# Patient Record
Sex: Male | Born: 1960 | Race: Black or African American | Hispanic: No | Smoking: Never smoker
Health system: Southern US, Community
[De-identification: ages and names within clinical notes are randomized; demographics above are authoritative.]

## PROBLEM LIST (undated history)

## (undated) DIAGNOSIS — M1990 Unspecified osteoarthritis, unspecified site: Secondary | ICD-10-CM

## (undated) DIAGNOSIS — IMO0002 Reserved for concepts with insufficient information to code with codable children: Secondary | ICD-10-CM

## (undated) HISTORY — PX: CYST REMOVAL TRUNK: SHX6283

## (undated) HISTORY — DX: Unspecified osteoarthritis, unspecified site: M19.90

## (undated) HISTORY — DX: Reserved for concepts with insufficient information to code with codable children: IMO0002

---

## 2000-02-27 ENCOUNTER — Emergency Department (HOSPITAL_COMMUNITY): Admission: EM | Admit: 2000-02-27 | Discharge: 2000-02-27 | Payer: Self-pay | Admitting: Emergency Medicine

## 2001-10-05 ENCOUNTER — Encounter: Admission: RE | Admit: 2001-10-05 | Discharge: 2001-10-05 | Payer: Self-pay | Admitting: Occupational Medicine

## 2001-10-05 ENCOUNTER — Encounter: Payer: Self-pay | Admitting: Occupational Medicine

## 2001-10-13 ENCOUNTER — Encounter: Admission: RE | Admit: 2001-10-13 | Discharge: 2001-10-27 | Payer: Self-pay | Admitting: Occupational Medicine

## 2003-06-14 ENCOUNTER — Emergency Department (HOSPITAL_COMMUNITY): Admission: EM | Admit: 2003-06-14 | Discharge: 2003-06-14 | Payer: Self-pay | Admitting: Emergency Medicine

## 2004-06-11 ENCOUNTER — Ambulatory Visit: Payer: Self-pay | Admitting: Internal Medicine

## 2005-10-21 ENCOUNTER — Ambulatory Visit: Payer: Self-pay | Admitting: Internal Medicine

## 2005-11-18 ENCOUNTER — Ambulatory Visit: Payer: Self-pay | Admitting: Internal Medicine

## 2005-11-26 ENCOUNTER — Ambulatory Visit: Payer: Self-pay | Admitting: Internal Medicine

## 2005-12-30 ENCOUNTER — Ambulatory Visit: Payer: Self-pay | Admitting: Internal Medicine

## 2006-01-13 ENCOUNTER — Ambulatory Visit: Payer: Self-pay | Admitting: Internal Medicine

## 2006-04-12 ENCOUNTER — Ambulatory Visit: Payer: Self-pay | Admitting: Internal Medicine

## 2006-05-24 ENCOUNTER — Ambulatory Visit: Payer: Self-pay | Admitting: Internal Medicine

## 2006-06-30 ENCOUNTER — Ambulatory Visit: Payer: Self-pay | Admitting: Internal Medicine

## 2007-01-17 DIAGNOSIS — J309 Allergic rhinitis, unspecified: Secondary | ICD-10-CM | POA: Insufficient documentation

## 2007-01-24 ENCOUNTER — Ambulatory Visit: Payer: Self-pay | Admitting: Internal Medicine

## 2007-01-24 LAB — CONVERTED CEMR LAB
ALT: 18 units/L (ref 0–53)
AST: 25 units/L (ref 0–37)
Albumin: 3.9 g/dL (ref 3.5–5.2)
Alkaline Phosphatase: 47 units/L (ref 39–117)
BUN: 14 mg/dL (ref 6–23)
Basophils Absolute: 0 10*3/uL (ref 0.0–0.1)
Basophils Relative: 0.7 % (ref 0.0–1.0)
Bilirubin Urine: NEGATIVE
Bilirubin, Direct: 0.2 mg/dL (ref 0.0–0.3)
CO2: 31 meq/L (ref 19–32)
Calcium: 9.1 mg/dL (ref 8.4–10.5)
Chloride: 106 meq/L (ref 96–112)
Cholesterol: 177 mg/dL (ref 0–200)
Creatinine, Ser: 1.2 mg/dL (ref 0.4–1.5)
Eosinophils Absolute: 0.1 10*3/uL (ref 0.0–0.6)
Eosinophils Relative: 3.4 % (ref 0.0–5.0)
GFR calc Af Amer: 83 mL/min
GFR calc non Af Amer: 69 mL/min
Glucose, Bld: 91 mg/dL (ref 70–99)
Glucose, Urine, Semiquant: NEGATIVE
HCT: 42.2 % (ref 39.0–52.0)
HDL: 41.2 mg/dL (ref >39.0)
Hemoglobin: 14.5 g/dL (ref 13.0–17.0)
Ketones, urine, test strip: NEGATIVE
LDL Cholesterol: 128 mg/dL — ABNORMAL HIGH (ref 0–99)
Lymphocytes Relative: 33.9 % (ref 12.0–46.0)
MCHC: 34.4 g/dL (ref 30.0–36.0)
MCV: 95 fL (ref 78.0–100.0)
Monocytes Absolute: 0.3 10*3/uL (ref 0.2–0.7)
Monocytes Relative: 11.5 % — ABNORMAL HIGH (ref 3.0–11.0)
Neutro Abs: 1.5 10*3/uL (ref 1.4–7.7)
Neutrophils Relative %: 50.5 % (ref 43.0–77.0)
Nitrite: NEGATIVE
Platelets: 210 10*3/uL (ref 150–400)
Potassium: 4.4 meq/L (ref 3.5–5.1)
Protein, U semiquant: NEGATIVE
RBC: 4.45 M/uL (ref 4.22–5.81)
RDW: 12.8 % (ref 11.5–14.6)
Sodium: 142 meq/L (ref 135–145)
Specific Gravity, Urine: >=1.03
TSH: 1.29 microintl units/mL (ref 0.35–5.50)
Total Bilirubin: 2.1 mg/dL — ABNORMAL HIGH (ref 0.3–1.2)
Total CHOL/HDL Ratio: 4.3
Total Protein: 6.7 g/dL (ref 6.0–8.3)
Triglycerides: 38 mg/dL (ref 0–149)
Urobilinogen, UA: 0.2
VLDL: 8 mg/dL (ref 0–40)
WBC Urine, dipstick: NEGATIVE
WBC: 2.9 10*3/uL — ABNORMAL LOW (ref 4.5–10.5)
pH: 5.5

## 2007-01-31 ENCOUNTER — Ambulatory Visit: Payer: Self-pay | Admitting: Internal Medicine

## 2007-01-31 DIAGNOSIS — B360 Pityriasis versicolor: Secondary | ICD-10-CM | POA: Insufficient documentation

## 2007-03-30 ENCOUNTER — Ambulatory Visit: Payer: Self-pay | Admitting: Internal Medicine

## 2007-03-30 DIAGNOSIS — M1990 Unspecified osteoarthritis, unspecified site: Secondary | ICD-10-CM | POA: Insufficient documentation

## 2007-03-30 DIAGNOSIS — IMO0002 Reserved for concepts with insufficient information to code with codable children: Secondary | ICD-10-CM | POA: Insufficient documentation

## 2007-03-30 HISTORY — DX: Reserved for concepts with insufficient information to code with codable children: IMO0002

## 2007-03-31 ENCOUNTER — Telehealth: Payer: Self-pay | Admitting: Internal Medicine

## 2007-04-07 ENCOUNTER — Encounter: Payer: Self-pay | Admitting: Internal Medicine

## 2007-09-19 ENCOUNTER — Telehealth: Payer: Self-pay | Admitting: Internal Medicine

## 2009-06-03 ENCOUNTER — Telehealth: Payer: Self-pay | Admitting: Internal Medicine

## 2009-06-04 ENCOUNTER — Ambulatory Visit: Payer: Self-pay | Admitting: Internal Medicine

## 2009-06-04 DIAGNOSIS — R05 Cough: Secondary | ICD-10-CM

## 2009-06-04 DIAGNOSIS — J209 Acute bronchitis, unspecified: Secondary | ICD-10-CM

## 2009-06-04 DIAGNOSIS — R059 Cough, unspecified: Secondary | ICD-10-CM | POA: Insufficient documentation

## 2009-10-30 ENCOUNTER — Ambulatory Visit: Payer: Self-pay | Admitting: Internal Medicine

## 2009-10-30 LAB — CONVERTED CEMR LAB
ALT: 20 units/L (ref 0–53)
AST: 39 units/L — ABNORMAL HIGH (ref 0–37)
Albumin: 4.2 g/dL (ref 3.5–5.2)
Alkaline Phosphatase: 47 units/L (ref 39–117)
BUN: 14 mg/dL (ref 6–23)
Basophils Absolute: 0 10*3/uL (ref 0.0–0.1)
Basophils Relative: 0.9 % (ref 0.0–3.0)
Bilirubin, Direct: 0.2 mg/dL (ref 0.0–0.3)
Blood in Urine, dipstick: NEGATIVE
CO2: 30 meq/L (ref 19–32)
Calcium: 9 mg/dL (ref 8.4–10.5)
Chloride: 102 meq/L (ref 96–112)
Cholesterol: 166 mg/dL (ref 0–200)
Creatinine, Ser: 1 mg/dL (ref 0.4–1.5)
Eosinophils Absolute: 0.1 10*3/uL (ref 0.0–0.7)
Eosinophils Relative: 3 % (ref 0.0–5.0)
GFR calc non Af Amer: 103.8 mL/min (ref >60)
Glucose, Bld: 75 mg/dL (ref 70–99)
Glucose, Urine, Semiquant: NEGATIVE
HCT: 43.7 % (ref 39.0–52.0)
HDL: 47.4 mg/dL (ref >39.00)
Hemoglobin: 14.9 g/dL (ref 13.0–17.0)
LDL Cholesterol: 110 mg/dL — ABNORMAL HIGH (ref 0–99)
Lymphocytes Relative: 32.9 % (ref 12.0–46.0)
Lymphs Abs: 1.1 10*3/uL (ref 0.7–4.0)
MCHC: 34.3 g/dL (ref 30.0–36.0)
MCV: 96.1 fL (ref 78.0–100.0)
Monocytes Absolute: 0.3 10*3/uL (ref 0.1–1.0)
Monocytes Relative: 10.1 % (ref 3.0–12.0)
Neutro Abs: 1.8 10*3/uL (ref 1.4–7.7)
Neutrophils Relative %: 53.1 % (ref 43.0–77.0)
Nitrite: NEGATIVE
PSA: 0.93 ng/mL (ref 0.10–4.00)
Platelets: 184 10*3/uL (ref 150.0–400.0)
Potassium: 4.1 meq/L (ref 3.5–5.1)
RBC: 4.54 M/uL (ref 4.22–5.81)
RDW: 14.3 % (ref 11.5–14.6)
Sodium: 141 meq/L (ref 135–145)
Specific Gravity, Urine: 1.02
TSH: 1.41 microintl units/mL (ref 0.35–5.50)
Total Bilirubin: 1.6 mg/dL — ABNORMAL HIGH (ref 0.3–1.2)
Total CHOL/HDL Ratio: 4
Total Protein: 6.9 g/dL (ref 6.0–8.3)
Triglycerides: 45 mg/dL (ref 0.0–149.0)
Urobilinogen, UA: 1
VLDL: 9 mg/dL (ref 0.0–40.0)
WBC Urine, dipstick: NEGATIVE
WBC: 3.4 10*3/uL — ABNORMAL LOW (ref 4.5–10.5)
pH: 7

## 2009-11-06 ENCOUNTER — Ambulatory Visit: Payer: Self-pay | Admitting: Internal Medicine

## 2009-11-06 DIAGNOSIS — D045 Carcinoma in situ of skin of trunk: Secondary | ICD-10-CM

## 2009-12-09 ENCOUNTER — Telehealth: Payer: Self-pay | Admitting: Internal Medicine

## 2009-12-18 ENCOUNTER — Encounter (INDEPENDENT_AMBULATORY_CARE_PROVIDER_SITE_OTHER): Payer: Self-pay | Admitting: *Deleted

## 2010-05-05 ENCOUNTER — Encounter (INDEPENDENT_AMBULATORY_CARE_PROVIDER_SITE_OTHER): Payer: Self-pay | Admitting: *Deleted

## 2010-05-05 ENCOUNTER — Telehealth: Payer: Self-pay | Admitting: Internal Medicine

## 2010-05-08 ENCOUNTER — Encounter (INDEPENDENT_AMBULATORY_CARE_PROVIDER_SITE_OTHER): Payer: Self-pay | Admitting: *Deleted

## 2010-05-20 ENCOUNTER — Encounter (INDEPENDENT_AMBULATORY_CARE_PROVIDER_SITE_OTHER): Payer: Self-pay | Admitting: *Deleted

## 2010-05-21 ENCOUNTER — Telehealth: Payer: Self-pay | Admitting: Internal Medicine

## 2010-05-21 ENCOUNTER — Ambulatory Visit: Payer: Self-pay | Admitting: Internal Medicine

## 2010-06-01 ENCOUNTER — Ambulatory Visit: Payer: Self-pay | Admitting: Internal Medicine

## 2010-06-02 ENCOUNTER — Encounter: Payer: Self-pay | Admitting: Internal Medicine

## 2010-06-26 ENCOUNTER — Telehealth: Payer: Self-pay | Admitting: Internal Medicine

## 2010-06-29 ENCOUNTER — Ambulatory Visit
Admission: RE | Admit: 2010-06-29 | Discharge: 2010-06-29 | Payer: Self-pay | Source: Home / Self Care | Attending: Internal Medicine | Admitting: Internal Medicine

## 2010-07-02 ENCOUNTER — Telehealth: Payer: Self-pay | Admitting: Internal Medicine

## 2010-07-12 ENCOUNTER — Encounter: Payer: Self-pay | Admitting: Internal Medicine

## 2010-07-23 NOTE — Miscellaneous (Signed)
Summary: LEC PV  Clinical Lists Changes  Medications: Added new medication of MOVIPREP 100 GM  SOLR (PEG-KCL-NACL-NASULF-NA ASC-C) As per prep instructions. - Signed Rx of MOVIPREP 100 GM  SOLR (PEG-KCL-NACL-NASULF-NA ASC-C) As per prep instructions.;  #1 x 0;  Signed;  Entered by: Ezra Sites RN;  Authorized by: Iva Boop MD, [facility];  Method used: Electronically to [facility]. #[id number]*, [address], [place], [place], [place]  [address], Ph: [phone], Fax: [phone] Observations: Added new observation of NKA: T (05/21/2010 10:02)    Prescriptions: MOVIPREP 100 GM  SOLR (PEG-KCL-NACL-NASULF-NA ASC-C) As per prep instructions.  #1 x 0   Entered by:   Ezra Sites RN   Authorized by:   Iva Boop MD, [facility]   Signed by:   Ezra Sites RN on 05/21/2010   Method used:   Electronically to        [facility]. [phone]* (retail)       [address]       [place], [place]  [address]       Ph: [phone]       Fax: [phone]   RxID:   [id number]

## 2010-07-23 NOTE — Letter (Signed)
Summary: Pre Visit Letter Revised  [facility]  [address] [place], [place] [address]   Phone: [phone]  Fax: [phone]        05/05/2010 MRN: [id number]   [facility] [address] [place], [place]  [address]             Procedure Date:  May 26, 2010  Welcome to the Gastroenterology Division at [facility].    You are scheduled to see a nurse for your pre-procedure visit on 05/08/10 at 1:00 P.M.  on the 3rd floor at [facility], [address]  We ask that you try to arrive at our office 15 minutes prior to your appointment time to allow for check-in.  Please take a minute to review the attached form.  If you answer "Yes" to one or more of the questions on the first page, we ask that you call the person listed at your earliest opportunity.  If you answer "No" to all of the questions, please complete the rest of the form and bring it to your appointment.    Your nurse visit will consist of discussing your medical and surgical history, your immediate family medical history, and your medications.   If you are unable to list all of your medications on the form, please bring the medication bottles to your appointment and we will list them.  We will need to be aware of both prescribed and over the counter drugs.  We will need to know exact dosage information as well.    Please be prepared to read and sign documents such as consent forms, a financial agreement, and acknowledgement forms.  If necessary, and with your consent, a friend or relative is welcome to sit-in on the nurse visit with you.  Please bring your insurance card so that we may make a copy of it.  If your insurance requires a referral to see a specialist, please bring your referral form from your primary care physician.  No co-pay is required for this nurse visit.     If you cannot keep your appointment, please call [phone] to cancel or reschedule prior to your appointment  date.  This allows [place] the opportunity to schedule an appointment for another patient in need of care.    Thank you for choosing [facility] for your medical needs.  We appreciate the opportunity to care for you.  Please visit [place] at our website  to learn more about our practice.  Sincerely, The Gastroenterology Division

## 2010-07-23 NOTE — Progress Notes (Signed)
  Phone Note Call from Patient   Summary of Call: request z pack for uri/bmw Initial call taken by: Willy Eddy, LPN,  December 09, 2009 10:39 AM  Follow-up for Phone Call        per dr Lovell Sheehan- may have [facility] pack[facility] [phone] Follow-up by: Willy Eddy, LPN,  December 09, 2009 10:39 AM    New/Updated Medications: ZITHROMAX Z-PAK 250 MG TABS (AZITHROMYCIN) take as directed Prescriptions: ZITHROMAX Z-PAK 250 MG TABS (AZITHROMYCIN) take as directed  #1 x 0   Entered by:   Willy Eddy, LPN   Authorized by:   Stacie Glaze MD   Signed by:   Willy Eddy, LPN on 57/84/6962   Method used:   Electronically to        [facility]. [phone]* (retail)       [address]       [place], [place]  [address]       Ph: [phone]       Fax: [phone]   RxID:   [id number]

## 2010-07-23 NOTE — Progress Notes (Signed)
  Phone Note Call from Patient   Caller: Patient Call For: Stacie Glaze MD Reason for Call: Acute Illness Complaint: Cough/Sore throat Summary of Call: Pt states he has some upper respiratory congestion and cough.  Would like the Atuss that Dr. Lovell Sheehan gave him a year ago.  He had a refill but it expired.  No fever or chest congestion...Marland KitchenMarland KitchenJust started 2 days ago...Marland KitchenMarland Kitchenasking about an antibiotic. Initial call taken by: Lynann Beaver CMA AAMA,  June 26, 2010 11:05 AM  Follow-up for Phone Call        per dr Lovell Sheehan- may refill atuss but if he request antibioitc,will need ov Follow-up by: Willy Eddy, LPN,  June 26, 2010 11:27 AM  Additional Follow-up for Phone Call Additional follow up Details #1::        Notified pt. Additional Follow-up by: Lynann Beaver CMA AAMA,  June 26, 2010 11:36 AM

## 2010-07-23 NOTE — Letter (Signed)
Summary: [facility] Referral (unable to schedule) Notification  [facility]  [address] [place], [place] [address]   Phone: [phone]  Fax: [phone]      December 18, 2009 Joseph Boyle 1961/04/16 MRN: [id number]   [facility] [address] [place], [place]  [address]   Dear Dr. Lovell Sheehan:   Thank you for your kind referral of the above patient. We have attempted to schedule the recommended Colonoscopy but have been unable to schedule because:  _x_ The patient was not available by phone and/or has not returned our calls.  __ The patient declined to schedule the procedure at this time.  We appreciate the referral and hope that we will have the opportunity to treat this patient in the future.    Sincerely,   [facility]  Vania Rea. Jarold Motto M.D. Hedwig Morton. Juanda Chance M.D. Venita Lick. Russella Dar M.D. Wilhemina Bonito. Marina Goodell M.D. Barbette Hair. Arlyce Dice M.D. Iva Boop M.D. Cheron Every.D.

## 2010-07-23 NOTE — Progress Notes (Signed)
Summary: colonoscopy  Phone Note Call from Patient   Caller: Patient Call For: Stacie Glaze MD Summary of Call: Pt is having a colonoscopy and wants to know if it is necessary to do this. [phone]  Pt. wants to talk to Dr. Lovell Sheehan, Initial call taken by: Lynann Beaver CMA AAMA,  May 21, 2010 10:38 AM  Follow-up for Phone Call        yes!!!! Follow-up by: Stacie Glaze MD,  May 21, 2010 10:45 AM     Appended Document: colonoscopy left message on machine

## 2010-07-23 NOTE — Assessment & Plan Note (Signed)
Summary: cough//ccm   Vital Signs:  Patient profile:   50 year old male Pulse rate:   70 / minute BP sitting:   120 / 90  (right arm)  Vitals Entered By: Kyung Rudd, CMA (June 29, 2010 1:14 PM) CC: cough, congestion x 4days...requesting z-pak. Atuss ds rx'ed by Dr. Lovell Sheehan on Friday is no longer being manufactured according to [facility]    CC:  cough and congestion x 4days...requesting z-pak. Atuss ds rx'ed by Dr. Lovell Sheehan on Friday is no longer being manufactured according to [facility] .  History of Present Illness: Patient presents to clinic as a workin for evaluation of cough. Notes 4d h/o cough productive for yellow sputum, scratchy throat and nasal drainage without f/c, dyspnea or wheeze. Attempting otc mucinex dm with mild improvement. +sick exposure with daughter and wife. No other alleviating or exacerbating factors.  Current Medications (verified): 1)  None  Allergies (verified): No Known Drug Allergies  Past History:  Past medical, surgical, family and social histories (including risk factors) reviewed for relevance to current acute and chronic problems.  Past Medical History: Reviewed history from 03/30/2007 and no changes required. Allergic rhinitis Osteoarthritis  Past Surgical History: Reviewed history from 01/31/2007 and no changes required. Rotator cuff repair 2007  Family History: Reviewed history from 01/31/2007 and no changes required. Father  unknown Family History Hypertension Mother  Social History: Reviewed history from 01/31/2007 and no changes required. Never Smoked Alcohol use-yes Regular exercise-yes  Review of Systems      See HPI General:  Denies chills, fatigue, fever, and sweats. Eyes:  Denies eye irritation, eye pain, and red eye. ENT:  Complains of sore throat; denies ear discharge and earache. Resp:  Complains of cough and sputum productive; denies coughing up blood, shortness of breath, and wheezing. Derm:   Denies changes in color of skin, flushing, and rash.  Physical Exam  General:  Well-developed,well-nourished,in no acute distress; alert,appropriate and cooperative throughout examination Head:  Normocephalic and atraumatic without obvious abnormalities. No apparent alopecia or balding. Eyes:  pupils equal, pupils round, corneas and lenses clear, and no injection.   Ears:  External ear exam shows no significant lesions or deformities.  Otoscopic examination reveals clear canals, tympanic membranes are intact bilaterally without bulging, retraction, inflammation or discharge. Hearing is grossly normal bilaterally. Nose:  External nasal examination shows no deformity or inflammation. Nasal mucosa are pink and moist without lesions or exudates. Mouth:  mild erythema posterior pharynx without exudate. no posterior lymphoid hypertrophy, no lesions, and no aphthous ulcers.   Neck:  No deformities, masses, or tenderness noted. Lungs:  Normal respiratory effort, chest expands symmetrically. Lungs are clear to auscultation, no crackles or wheezes. Heart:  Normal rate and regular rhythm. S1 and S2 normal without gallop, murmur, click, rub or other extra sounds. Skin:  turgor normal, color normal, and no rashes.     Impression & Recommendations:  Problem # 1:  URI (ICD-465.9) Assessment New Discussed with duration of symptoms etiology may still be viral. Given abx prescription to hold. Begin if symptoms no better after total of 8-10 days. Continue otc symptomatic relief as needed. Increase by mouth fluid intake. Followup if no improvement or worsening.  Complete Medication List: 1)  Zithromax Z-pak 250 Mg Tabs (Azithromycin) .... As directed Prescriptions: ZITHROMAX Z-PAK 250 MG TABS (AZITHROMYCIN) as directed  #1 x 0   Entered and Authorized by:   Edwyna Perfect MD   Signed by:   Edwyna Perfect MD  on 06/29/2010   Method used:   Print then Give to Patient   RxID:   [id number]    Orders  Added: 1)  Est. Patient Level III [03474]

## 2010-07-23 NOTE — Procedures (Signed)
Summary: Colonoscopy  Patient: Joseph Boyle Note: All result statuses are Final unless otherwise noted.  Tests: (1) Colonoscopy (COL)   COL Colonoscopy           DONE     [facility]     [address]     [place], [place]  [address]           COLONOSCOPY PROCEDURE REPORT           PATIENT:  [name], [name]  MR#:  [id number]     BIRTHDATE:  1961-04-08, 49 yrs. old  GENDER:  male     ENDOSCOPIST:  Iva Boop, MD, [facility]     REF. BY:  Stacie Glaze, M.D.     PROCEDURE DATE:  06/01/2010     PROCEDURE:  Colonoscopy with biopsy and snare polypectomy     ASA CLASS:  Class I     INDICATIONS:  Routine Risk Screening     MEDICATIONS:   Fentanyl 75 mcg IV, Versed 10 mg IV           DESCRIPTION OF PROCEDURE:   After the risks benefits and     alternatives of the procedure were thoroughly explained, informed     consent was obtained.  Digital rectal exam was performed and     revealed no abnormalities and normal prostate.   The LB CF-H180AL     [id number] endoscope was introduced through the anus and advanced to     the cecum, which was identified by both the appendix and ileocecal     valve, without limitations.  The quality of the prep was     excellent, using MoviPrep.  The instrument was then slowly     withdrawn as the colon was fully examined. Insertion: 10:20     minutes Withdrawal: 15 minutes     <<PROCEDUREIMAGES>>           FINDINGS:  Four polyps were found (ascending (2) and descending     (2). They were diminutive. Two  polyps were removed using cold     biopsy forceps. and two polyps were snared without cautery.     Retrieval was successful for one of two. The coon was somewhat     redundant. This was otherwise a normal examination of the colon.     Retroflexed views in the rectum revealed no abnormalities.    The     scope was then withdrawn from the patient and the procedure     completed.           COMPLICATIONS:  None     ENDOSCOPIC IMPRESSION:     1) Four  diminutive  polyps removed, three recovered     2) Otherwise normal examination, excellent prep.     RECOMMENDATIONS:     Consider deep sedation next time.     REPEAT EXAM:  In for Colonoscopy, pending biopsy results.           Iva Boop, MD, [facility]           CC:  Stacie Glaze, MD     The Patient           n.     [name] Doctor:   Iva Boop at 06/01/2010 11:08 AM           Tyshan, [place], [id number]  Note: An exclamation mark (!) indicates a result that was not dispersed into the flowsheet. Document Creation Date: 06/01/2010 11:09 AM _______________________________________________________________________  (  1) Order result status: Final Collection or observation date-time: 06/01/2010 10:55 Requested date-time:  Receipt date-time:  Reported date-time:  Referring Physician:   Ordering Physician: Stan Head [phone]) Specimen Source:  Source: Launa Grill Order Number: [id number] Lab site:   Appended Document: Colonoscopy   Colonoscopy  Procedure date:  06/01/2010  Findings:          1) Four diminutive  polyps removed, three recovered     2) Otherwise normal examination, excellent prep.  . Colon, polyp(s), ascending (1), descending (2) :  -  TUBULAR ADENOMA (X1); NEGATIVE FOR HIGH GRADE DYSPLASIA OR MALIGNANCY. -  FRAGMENTS OF HYPERPLASTIC POLYPS (X2).  Comments:      Repeat colonoscopy in 5 years.   Procedures Next Due Date:    Colonoscopy: 05/2015   Appended Document: Colonoscopy     Procedures Next Due Date:    Colonoscopy: 05/2015

## 2010-07-23 NOTE — Letter (Signed)
Summary: Patient Notice- Polyp Results  [facility]  [address] [place], [place] [address]   Phone: [phone]  Fax: [phone]        June 02, 2010 MRN: [id number]    [facility] [address] [place], [place]  [address]    Dear Joseph Boyle,  Some of the polyps removed from your colon were adenomatous. This means that they were pre-cancerous or that  they had the potential to change into cancer over time. The other polyps were not precancerous.  I recommend that you have a repeat colonoscopy in 5 years to determine if you have developed any new polyps over time and screen for colorectal cancer. If you develop any new rectal bleeding, abdominal pain or significant bowel habit changes, please contact us before then.  In addition to repeating colonoscopy, changing health habits may reduce your risk of having more colon or rectal  polyps and possibly, colorectal cancer. You may lower your risk of future polyps and colorectal cancer by adopting healthy habits such as not smoking or using tobacco (if you do), being physically active, losing weight (if overweight), and eating a diet which includes fruits and vegetables and limits red meat.  Please call us if you are having persistent problems or have questions about your condition that have not been fully answered at this time.  Sincerely,  Iva Boop MD, [facility]  This letter has been electronically signed by your physician.  Appended Document: Patient Notice- Polyp Results Letter mailed

## 2010-07-23 NOTE — Letter (Signed)
Summary: [facility] Instructions  [facility]  [address] [place], [place] [address]   Phone: [phone]  Fax: [phone]       NAYIB REMER    Jun 06, 1961    MRN: [id number]        Procedure Day /Date:  Monday 06/01/2010     Arrival Time:  9:00 am      Procedure Time: 10:00 am     Location of Procedure:                    _x _  [facility] (4th Floor)                        PREPARATION FOR COLONOSCOPY WITH MOVIPREP   Starting 5 days prior to your procedure Wednesday 12/7 do not eat nuts, seeds, popcorn, corn, beans, peas,  salads, or any raw vegetables.  Do not take any fiber supplements (e.g. Metamucil, Citrucel, and Benefiber).  THE DAY BEFORE YOUR PROCEDURE         DATE: Sunday 12/11  1.  Drink clear liquids the entire day-NO SOLID FOOD  2.  Do not drink anything colored red or purple.  Avoid juices with pulp.  No orange juice.  3.  Drink at least 64 oz. (8 glasses) of fluid/clear liquids during the day to prevent dehydration and help the prep work efficiently.  CLEAR LIQUIDS INCLUDE: Water Jello Ice Popsicles Tea (sugar ok, no milk/cream) Powdered fruit flavored drinks Coffee (sugar ok, no milk/cream) Gatorade Juice: apple, white grape, white cranberry  Lemonade Clear bullion, consomm, broth Carbonated beverages (any kind) Strained chicken noodle soup Hard Candy                             4.  In the morning, mix first dose of MoviPrep solution:    Empty 1 Pouch A and 1 Pouch B into the disposable container    Add lukewarm drinking water to the top line of the container. Mix to dissolve    Refrigerate (mixed solution should be used within 24 hrs)  5.  Begin drinking the prep at 5:00 p.m. The MoviPrep container is divided by 4 marks.   Every 15 minutes drink the solution down to the next mark (approximately 8 oz) until the full liter is complete.   6.  Follow completed prep with 16 oz of clear liquid of your choice  (Nothing red or purple).  Continue to drink clear liquids until bedtime.  7.  Before going to bed, mix second dose of MoviPrep solution:    Empty 1 Pouch A and 1 Pouch B into the disposable container    Add lukewarm drinking water to the top line of the container. Mix to dissolve    Refrigerate  THE DAY OF YOUR PROCEDURE      DATE: Monday 12/12  Beginning at 5:00 a.m. (5 hours before procedure):         1. Every 15 minutes, drink the solution down to the next mark (approx 8 oz) until the full liter is complete.  2. Follow completed prep with 16 oz. of clear liquid of your choice.    3. You may drink clear liquids until 8:00 am (2 HOURS BEFORE PROCEDURE).   MEDICATION INSTRUCTIONS  Unless otherwise instructed, you should take regular prescription medications with a small sip of water   as early as possible the morning  of your procedure.           OTHER INSTRUCTIONS  You will need a responsible adult at least 52 years of age to accompany you and drive you home.   This person must remain in the waiting room during your procedure.  Wear loose fitting clothing that is easily removed.  Leave jewelry and other valuables at home.  However, you may wish to bring a book to read or  an iPod/MP3 player to listen to music as you wait for your procedure to start.  Remove all body piercing jewelry and leave at home.  Total time from sign-in until discharge is approximately 2-3 hours.  You should go home directly after your procedure and rest.  You can resume normal activities the  day after your procedure.  The day of your procedure you should not:   Drive   Make legal decisions   Operate machinery   Drink alcohol   Return to work  You will receive specific instructions about eating, activities and medications before you leave.    The above instructions have been reviewed and explained to me by   Ezra Sites RN  May 21, 2010 10:16 AM     I fully understand  and can verbalize these instructions _____________________________ Date _________

## 2010-07-23 NOTE — Letter (Signed)
Summary: Pre Visit No Show Letter  [facility]  [address] [place], [place] [address]   Phone: [phone]  Fax: [phone]        May 08, 2010 MRN: [id number]    [facility] [address] [place], [place]  [address]    Dear Joseph Boyle,   We have been unable to reach you by phone concerning the pre-procedure visit that you missed on  Friday, 05-08-10. For this reason,your procedure scheduled on 05-26-10 has been cancelled. Our scheduling staff will gladly assist you with rescheduling your appointments at a more convenient time. Please call our office at [phone] between the hours of 8:00am and 5:00pm, press option #2 to reach an appointment scheduler. Please consider updating your contact numbers at this time so that we can reach you by phone in the future with schedule changes or results.    Thank you,    Sherren Kerns RN [facility]  Appended Document: Pre Visit No Show Letter Called patient home #  3 times,no answer. Left message with our number for patient to call back to rsc. Also called work # ,no answer. Letter sent and procedure cancelled since he did not return my call by 5pm today. Sherren Kerns RN  May 08, 2010 5:06 PM    Clinical Lists Changes

## 2010-07-23 NOTE — Assessment & Plan Note (Signed)
Summary: cpx/njr   Vital Signs:  Patient profile:   50 year old male Height:      71 inches Weight:      174 pounds BMI:     24.36 Temp:     98.1 degrees F oral Pulse rate:   68 / minute Resp:     14 per minute BP sitting:   130 / 80  (left arm)  Vitals Entered By: Willy Eddy, LPN (Nov 06, 2009 10:58 AM) CC: cpx-needs colonoscopy   CC:  cpx-needs colonoscopy.  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) 6)  Allergic Rhinitis  (ICD-477.9) 7)  Preventive Health Care  (ICD-V70.0) 8)  Allergic Rhinitis  (ICD-477.9)  Current Medications (verified): 1)  None  Allergies (verified): No Known Drug Allergies   Impression & Recommendations:  Problem # 1:  PREVENTIVE HEALTH CARE (ICD-V70.0)  Orders: EKG w/ Interpretation ([address]) Gastroenterology Referral (GI)  Td Booster: Td (12/23/2005)   Chol: 166 (10/30/2009)   HDL: 47.40 (10/30/2009)   LDL: 110 (10/30/2009)   TG: 45.0 (10/30/2009) TSH: 1.41 (10/30/2009)   PSA: 0.93 (10/30/2009)  Discussed using sunscreen, use of alcohol, drug use, self testicular exam, routine dental care, routine eye care, routine physical exam, seat belts, multiple vitamins, osteoporosis prevention, adequate calcium intake in diet, and recommendations for immunizations.  Discussed exercise and checking cholesterol.  Discussed gun safety, safe sex, and contraception. Also recommend checking PSA.  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  cpx 2)  mole removal   Bonnye to set up

## 2010-07-23 NOTE — Progress Notes (Signed)
Summary: change of meds  Phone Note Call from Patient   Caller: Patient Call For: Stacie Glaze MD Summary of Call: No Atuss at the pharmacy.  Pt is requesting Hydromet.  Okayed per Dr. Lovell Sheehan. Initial call taken by: Debby Meadows CMA AAMA,  July 02, 2010 9:01 AM    New/Updated Medications: HYDROMET 5-1.5 MG/5ML SYRP (HYDROCODONE-HOMATROPINE) 1-2 teaspoons q 12 hr as needed cough Prescriptions: HYDROMET 5-1.5 MG/5ML SYRP (HYDROCODONE-HOMATROPINE) 1-2 teaspoons q 12 hr as needed cough  #6 oz x 0   Entered by:   Lynann Beaver CMA AAMA   Authorized by:   Stacie Glaze MD   Signed by:   Lynann Beaver CMA AAMA on 07/02/2010   Method used:   Telephoned to ...       [facility]  [place]. [phone]* (retail)       [address]       [place], [place]  [address]       Ph: [phone]       Fax: [phone]   RxID:   [id number]

## 2010-07-23 NOTE — Progress Notes (Signed)
Summary: Pt wants to know if he needs to get conlonscopy done  Phone Note Call from Patient Call back at [phone] cell   Caller: Patient Reason for Call: Acute Illness Summary of Call: Pt would like to know if he needs to get a colonoscopy done this year or no? If so, could Dr Lovell Sheehan do referral to start the sch process.   Initial call taken by: Lucy Antigua,  May 05, 2010 3:19 PM  Follow-up for Phone Call        told pt that they had tried to contact him and was unsuccessful- gi number given and was to told to call them to set up colonoscopy Follow-up by: Willy Eddy, LPN,  May 05, 2010 3:34 PM

## 2010-08-25 ENCOUNTER — Ambulatory Visit: Payer: Self-pay | Admitting: Internal Medicine

## 2010-08-25 ENCOUNTER — Encounter: Payer: Self-pay | Admitting: Internal Medicine

## 2010-08-25 VITALS — BP 120/82 | Temp 98.2°F | Wt 182.0 lb

## 2010-08-25 DIAGNOSIS — J329 Chronic sinusitis, unspecified: Secondary | ICD-10-CM

## 2010-08-25 DIAGNOSIS — R05 Cough: Secondary | ICD-10-CM

## 2010-08-25 DIAGNOSIS — R059 Cough, unspecified: Secondary | ICD-10-CM

## 2010-08-25 MED ORDER — DOXYCYCLINE HYCLATE 100 MG PO TABS: 100.0000 mg | ORAL_TABLET | Freq: Two times a day (BID) | ORAL | Status: DC

## 2010-08-25 MED ORDER — BENZONATATE 200 MG PO CAPS: 200.0000 mg | ORAL_CAPSULE | Freq: Three times a day (TID) | ORAL | Status: AC | PRN

## 2010-08-25 MED ORDER — METHYLPREDNISOLONE (PAK) 4 MG PO TABS: 4.0000 mg | ORAL_TABLET | Freq: Every day | ORAL | Status: AC

## 2010-08-26 ENCOUNTER — Ambulatory Visit: Payer: Self-pay | Admitting: Internal Medicine

## 2010-11-05 ENCOUNTER — Encounter: Payer: Self-pay | Admitting: Internal Medicine

## 2010-11-06 NOTE — Assessment & Plan Note (Signed)
[facility]                             PULMONARY OFFICE NOTE   NAME:Joseph Boyle, Joseph Boyle                       MRN:          [id number]  DATE:06/30/2006                            DOB:          13-Jul-1960    PULMONARY/ALLERGY CONSULTATION   PROBLEM:  Allergy consultation at the kind request of Dr. Lovell Sheehan for  this 50 year old man with rash and pruritus.   HISTORY:  Over the last 3 to 4 months, he has had a problem with  generalized shifting itching. If he scratches the pruritic areas, he  gets raised inflammation that sounds like a localized hive. 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 His initial theory was that powder from the gloves was causing  the itching, although there has been no specific glove distribution rash  or symptom. He has had no respiratory problems. If he wears a hat, he  will get itching around the hat band. The heat of a shower makes this  much worse. In his work area, he is near large containers of warm water,  which raise the temperature of the workplace. Benadryl was initially  sufficient and seemed to help. He has been given Xyzal by Dr. Lovell Sheehan  and says that works fast. He only needs to take a Xyzal every 2 or 3  days. Based on his suspicion that latex gloves were the problem, he has  replaced those with latex-free and powder-free gloves, but he still  needed to use Xyzal most recently 3 days ago. It is not obvious that he  has been doing better since the glove change. He also gets pruritic if  he plays tennis and sweats. Since the problem just began in October, he  has not recognized a specific season pattern. We talked about other  potential exposures. His wife is using Garment/textile technologist. He  had her switch away from a fragrance containing soap. There is no  previous history of allergy or other significant health problems.   MEDICATIONS:  Xyzal or Benadryl used  p.r.n.  No other routine medications.   ALLERGIES:  No medication allergy.   REVIEW OF SYSTEMS:  Essentially as per HPI. He has felt well with no  weight loss, adenopathy, wheeze, edema, joint pain, abdominal cramps or  other changes.   PAST MEDICAL HISTORY:  1. Surgical procedure to remove a cyst near this left scapula.  2. No history of significant health problems and specifically no      history of liver or thyroid disease, asthma or rhinitis. No      problems with foods, insects, aspirin or contrast dye exposure.   SOCIAL HISTORY:  Nonsmoker, little alcohol. Plays a lot of tennis.  Married. He is a Media planner. Home  environment is unremarkable.   FAMILY HISTORY:  Nobody that he is aware of with significant allergy  problems.   OBJECTIVE:  This is a well-developed, well-nourished, apparently  comfortable gentleman.  SKIN: Clear.  ADENOPATHY: None noted.  HEENT: Conjunctivae and oral mucosa  are clear. Nasal airways are clear  with no significant secretions.  CHEST: Quiet, clear lung fields.  HEART: Sounds regular without murmur or gallop.  ABDOMEN: No enlargement of liver or spleen.  EXTREMITIES: No cyanosis, clubbing or edema.   I took a latex-free glove and pressed it firmly in a 4 inch stripe down  the volar surface of his right forearm. I took a latex glove and  performed the same maneuver on the inside of his left arm. There was no  response to either. He had last had Xyzal 2 days ago.   IMPRESSION:  Urticaria, possibly related to latex, but more likely  related to heat. The gloves may be warming his hands some, but I have  not been able to demonstrate anything very specific. He says that Dr.  Lovell Sheehan drew a latex glove down his sternum and that area broke out in a  hive. The suggestion would be that either latex or pressure is  triggering the response and that he has enough residual Xyzal effect  from 2 days ago to be blocking it  today. Xyzal has a long half-life. I  am not clear that some other environmental exposure is not sensitizing  his skin and a prime suspect would be either some chemical used in the  workplace or the Bounce fabric softener sheets placed in the dryer.   PLAN:  1. Continue use of latex-free and powder-free gloves.  2. Use p.r.n. Xyzal, trying a half dose to see what the lowest      sufficient amount would be so we discover whether this is an      intermittent problem or a chronic problem that is being suppressed      his antihistamine.  3. Watch effect of heat and pressure.   Schedule return 1 month, earlier p.r.n. I appreciate the chance to meet  him.     Clinton D. Maple Hudson, MD, Tonny Bollman, FACP  Electronically Signed    CDY/MedQ  DD: 06/30/2006  DT: 06/30/2006  Job #: [id number]   cc:   Stacie Glaze, MD

## 2010-11-11 ENCOUNTER — Ambulatory Visit: Payer: Self-pay | Admitting: Internal Medicine

## 2010-11-25 ENCOUNTER — Encounter: Payer: Self-pay | Admitting: Family Medicine

## 2010-11-25 ENCOUNTER — Ambulatory Visit (INDEPENDENT_AMBULATORY_CARE_PROVIDER_SITE_OTHER): Payer: PRIVATE HEALTH INSURANCE | Admitting: Family Medicine

## 2010-11-25 VITALS — BP 140/88 | Temp 97.7°F

## 2010-11-25 DIAGNOSIS — R21 Rash and other nonspecific skin eruption: Secondary | ICD-10-CM

## 2010-11-25 MED ORDER — PREDNISONE 10 MG PO TABS: ORAL_TABLET | ORAL | Status: DC

## 2010-11-25 NOTE — Progress Notes (Signed)
  Subjective:    Patient ID: Joseph YANNUZZI, male    DOB: 1960-07-24, 50 y.o.   MRN: [id number]  HPI Patient presents with rash left forearm and right lower leg. Present for about 3 weeks. Rash is pruritic. No pain. No fever or chills. Took Benadryl and left forearm rash is clearing. Yesterday noticed some pruritus right lower leg and scratched the area and today has vesicle. Also use topical with minimal improvement   Review of Systems  Constitutional: Negative for fever and chills.  Skin: Positive for rash.       Objective:   Physical Exam  Constitutional: He appears well-developed and well-nourished.  Cardiovascular: Normal rate and regular rhythm.   Pulmonary/Chest: Effort normal and breath sounds normal. No respiratory distress. He has no wheezes. He has no rales.  Skin:       Patient has a couple of patches of nonspecific erythema right lower leg. Has vesicle which is about 1 cm diameter with clear liquid. No pustules. Non-scaly          Assessment & Plan:  Rash right lower leg, suspect contact dermatitis. Prednisone taper over the next several days. Followup as needed. Large bullous lesion right lower leg was drained with a 25-gauge needle. Clear liquid was expressed. Topical antibiotic and Band-Aid applied

## 2011-03-11 ENCOUNTER — Emergency Department (HOSPITAL_COMMUNITY)
Admission: EM | Admit: 2011-03-11 | Discharge: 2011-03-11 | Disposition: A | Discharge status: Home / Self Care | Payer: Commercial Indemnity | Attending: Emergency Medicine | Admitting: Emergency Medicine

## 2011-03-11 DIAGNOSIS — S0990XA Unspecified injury of head, initial encounter: Secondary | ICD-10-CM | POA: Insufficient documentation

## 2011-03-11 DIAGNOSIS — W208XXA Other cause of strike by thrown, projected or falling object, initial encounter: Secondary | ICD-10-CM | POA: Insufficient documentation

## 2011-03-11 DIAGNOSIS — S0100XA Unspecified open wound of scalp, initial encounter: Secondary | ICD-10-CM | POA: Insufficient documentation

## 2011-06-29 ENCOUNTER — Telehealth: Payer: Self-pay | Admitting: *Deleted

## 2011-06-29 NOTE — Telephone Encounter (Signed)
Pt aware it will be wed before answered

## 2011-06-29 NOTE — Telephone Encounter (Signed)
Chest congestion and cough x 3 days.  Would like Dr. Lovell Sheehan to call Rx into [facility] [phone]).  No fever.

## 2011-06-30 MED ORDER — AZITHROMYCIN 250 MG PO TABS: ORAL_TABLET | ORAL | Status: AC

## 2011-06-30 NOTE — Telephone Encounter (Signed)
Pt is aware waiting on MD °

## 2011-06-30 NOTE — Telephone Encounter (Signed)
Per dr jenkins-may have z pack 

## 2011-06-30 NOTE — Telephone Encounter (Signed)
Notified pt. 

## 2011-11-03 ENCOUNTER — Telehealth: Payer: Self-pay | Admitting: Internal Medicine

## 2011-11-03 NOTE — Telephone Encounter (Signed)
Pt has questions about a medication he is on, pt did not know the name of the medication and would not go into detail. Please contact

## 2011-11-03 NOTE — Telephone Encounter (Signed)
Pt cannot remember what he took a year ago when Dr. Lovell Sheehan gave him something for bronchitis. He will just make and appt with one of the the other MDS.Marland Kitchen

## 2011-11-04 ENCOUNTER — Ambulatory Visit (INDEPENDENT_AMBULATORY_CARE_PROVIDER_SITE_OTHER): Payer: Commercial Indemnity | Admitting: Family Medicine

## 2011-11-04 ENCOUNTER — Encounter: Payer: Self-pay | Admitting: Family Medicine

## 2011-11-04 VITALS — BP 130/82 | Temp 97.7°F | Wt 186.0 lb

## 2011-11-04 DIAGNOSIS — J209 Acute bronchitis, unspecified: Secondary | ICD-10-CM

## 2011-11-04 DIAGNOSIS — R21 Rash and other nonspecific skin eruption: Secondary | ICD-10-CM

## 2011-11-04 DIAGNOSIS — J329 Chronic sinusitis, unspecified: Secondary | ICD-10-CM

## 2011-11-04 MED ORDER — DOXYCYCLINE HYCLATE 100 MG PO TABS: 100.0000 mg | ORAL_TABLET | Freq: Two times a day (BID) | ORAL | Status: AC

## 2011-11-04 MED ORDER — PREDNISONE 10 MG PO TABS: ORAL_TABLET | ORAL | Status: DC

## 2011-11-04 NOTE — Patient Instructions (Signed)

## 2011-11-04 NOTE — Progress Notes (Signed)
  Subjective:    Patient ID: Joseph Boyle, male    DOB: 1960-11-09, 51 y.o.   MRN: [id number]  HPI  Progressive cough over 2 weeks. Productive of yellow sputum. Similar episode about last year this time. Denies allergies. No fever or chills. No dyspnea. He also had some bilateral maxillary facial pain and discolored mucus. Has previously responded well to doxycycline. No history of smoking. No headaches.  Review of Systems As per history of present illness    Objective:   Physical Exam  Constitutional: He appears well-developed and well-nourished.  HENT:  Right Ear: External ear normal.  Left Ear: External ear normal.  Mouth/Throat: Oropharynx is clear and moist.  Neck: Neck supple.  Cardiovascular: Normal rate and regular rhythm.   Pulmonary/Chest: Effort normal and breath sounds normal. No respiratory distress. He has no wheezes. He has no rales.  Musculoskeletal: He exhibits no edema.  Lymphadenopathy:    He has no cervical adenopathy.          Assessment & Plan:  Acute bronchitis/sinusitis. Doxycycline 100 mg twice a day for 10 days. Brief prednisone over 6 days. Followup when necessary

## 2011-11-09 ENCOUNTER — Telehealth: Payer: Self-pay

## 2011-11-09 MED ORDER — AZITHROMYCIN 250 MG PO TABS: ORAL_TABLET | ORAL | Status: AC

## 2011-11-09 NOTE — Telephone Encounter (Signed)
Called and spoke with pt about Dr. Lucie Leather recommendations.  Pt states he wants to discontinue the doxycycline and start the z-pak.  Pt advised to call office if symptoms worsen.  Rx sent to pharmacy.

## 2011-11-09 NOTE — Telephone Encounter (Signed)
If rash only confined to the posterior aspect of one arm doubt this is allergy related-to medication. If he has any generalized rash or hives would discontinue doxycycline and start Zithromax (Z-pack).  Would continue prednisone

## 2011-11-09 NOTE — Telephone Encounter (Signed)
Pt states he was in on 11/04/2011 to see Dr. Caryl Never.  Pt states he was given prednisone and doxycycline.  Pt states he has never taken this medication before and today pt woke up and pt has a rash on the back of his right arm.  Pt states he is on his way to [facility] to get some Benadryl but pt would like to know what else he can do.  Pt states he has never taken either of those medications before and in the past pt has taken a z-pak and medrol pak.  Pls advise.

## 2011-11-11 ENCOUNTER — Ambulatory Visit: Payer: Commercial Indemnity | Admitting: Family Medicine

## 2011-12-07 ENCOUNTER — Ambulatory Visit (INDEPENDENT_AMBULATORY_CARE_PROVIDER_SITE_OTHER): Payer: Commercial Indemnity | Admitting: Internal Medicine

## 2011-12-07 ENCOUNTER — Encounter: Payer: Self-pay | Admitting: Internal Medicine

## 2011-12-07 VITALS — BP 110/70 | HR 68 | Temp 98.6°F | Resp 14 | Ht 72.0 in | Wt 178.0 lb

## 2011-12-07 DIAGNOSIS — R05 Cough: Secondary | ICD-10-CM

## 2011-12-07 DIAGNOSIS — J309 Allergic rhinitis, unspecified: Secondary | ICD-10-CM

## 2011-12-07 DIAGNOSIS — R059 Cough, unspecified: Secondary | ICD-10-CM

## 2011-12-07 MED ORDER — BECLOMETHASONE DIPROPIONATE 80 MCG/ACT NA AERS: 2.0000 | INHALATION_SPRAY | Freq: Every day | NASAL | Status: DC

## 2011-12-07 NOTE — Progress Notes (Signed)
  Subjective:    Patient ID: Joseph Boyle, male    DOB: 12-09-1960, 51 y.o.   MRN: [id number]  HPI  Allergies with post nasla drip and chronic cough  Review of Systems  Constitutional: Negative for fever and fatigue.  HENT: Negative for hearing loss, congestion, neck pain and postnasal drip.   Eyes: Negative for discharge, redness and visual disturbance.  Respiratory: Positive for cough. Negative for shortness of breath and wheezing.   Cardiovascular: Negative for leg swelling.  Gastrointestinal: Negative for abdominal pain, constipation and abdominal distention.  Genitourinary: Negative for urgency and frequency.  Musculoskeletal: Negative for joint swelling and arthralgias.  Skin: Negative for color change and rash.  Neurological: Negative for weakness and light-headedness.  Hematological: Negative for adenopathy.  Psychiatric/Behavioral: Negative for behavioral problems.   Past Medical History  Diagnosis Date  . Allergy   . Arthritis     History   Social History  . Marital Status: Married    Spouse Name: N/A    Number of Children: N/A  . Years of Education: N/A   Occupational History  . Not on file.   Social History Main Topics  . Smoking status: Never Smoker   . Smokeless tobacco: Not on file  . Alcohol Use:   . Drug Use:   . Sexually Active:    Other Topics Concern  . Not on file   Social History Narrative  . No narrative on file    Past Surgical History  Procedure Date  . Rotator cuff repair   . Rotator cuff repair 2007    Family History  Problem Relation Age of Onset  . Hypertension Mother     Allergies  Allergen Reactions  . Doxycycline     rash    Current Outpatient Prescriptions on File Prior to Visit  Medication Sig Dispense Refill  . Beclomethasone Dipropionate (QNASL) 80 MCG/ACT AERS Place 2 sprays into the nose daily.        BP 110/70  Pulse 68  Temp 98.6 F (37 C)  Resp 14  Ht 6' (1.829 m)  Wt 178 lb (80.74 kg)  BMI 24.14  kg/m2       Objective:   Physical Exam  Nursing note and vitals reviewed. Constitutional: He appears well-developed and well-nourished.  HENT:  Head: Normocephalic and atraumatic.  Eyes: Conjunctivae are normal. Pupils are equal, round, and reactive to light.  Neck: Normal range of motion. Neck supple.  Cardiovascular: Normal rate and regular rhythm.   Pulmonary/Chest: Effort normal and breath sounds normal.  Abdominal: Soft. Bowel sounds are normal.          Assessment & Plan:  Trial of q nasal CPX

## 2011-12-07 NOTE — Patient Instructions (Signed)
The patient is instructed to continue all medications as prescribed. Schedule followup with check out clerk upon leaving the clinic  

## 2012-03-01 ENCOUNTER — Other Ambulatory Visit (INDEPENDENT_AMBULATORY_CARE_PROVIDER_SITE_OTHER): Payer: Commercial Indemnity

## 2012-03-01 DIAGNOSIS — Z0000 Encounter for general adult medical examination without abnormal findings: Secondary | ICD-10-CM

## 2012-03-01 DIAGNOSIS — Z13220 Encounter for screening for lipoid disorders: Secondary | ICD-10-CM

## 2012-03-01 LAB — LIPID PANEL
HDL: 51.1 mg/dL (ref >39.00)
Total CHOL/HDL Ratio: 4
Triglycerides: 50 mg/dL (ref 0.0–149.0)

## 2012-03-01 LAB — CBC WITH DIFFERENTIAL/PLATELET
Basophils Relative: 0.9 % (ref 0.0–3.0)
Eosinophils Relative: 3.4 % (ref 0.0–5.0)
HCT: 44.3 % (ref 39.0–52.0)
Hemoglobin: 14.6 g/dL (ref 13.0–17.0)
Lymphs Abs: 1.1 10*3/uL (ref 0.7–4.0)
MCV: 95.7 fl (ref 78.0–100.0)
Monocytes Absolute: 0.4 10*3/uL (ref 0.1–1.0)
Monocytes Relative: 10.2 % (ref 3.0–12.0)
RBC: 4.63 Mil/uL (ref 4.22–5.81)
WBC: 4 10*3/uL — ABNORMAL LOW (ref 4.5–10.5)

## 2012-03-01 LAB — POCT URINALYSIS DIPSTICK
Bilirubin, UA: NEGATIVE
Blood, UA: NEGATIVE
Ketones, UA: NEGATIVE
Leukocytes, UA: NEGATIVE
Nitrite, UA: NEGATIVE
Protein, UA: NEGATIVE
pH, UA: 5.5

## 2012-03-01 LAB — BASIC METABOLIC PANEL
BUN: 18 mg/dL (ref 6–23)
CO2: 28 mEq/L (ref 19–32)
Chloride: 104 mEq/L (ref 96–112)
Creatinine, Ser: 1 mg/dL (ref 0.4–1.5)
Potassium: 4.2 mEq/L (ref 3.5–5.1)

## 2012-03-01 LAB — HEPATIC FUNCTION PANEL
ALT: 14 U/L (ref 0–53)
AST: 23 U/L (ref 0–37)
Total Protein: 6.8 g/dL (ref 6.0–8.3)

## 2012-03-01 LAB — TSH: TSH: 1.61 u[IU]/mL (ref 0.35–5.50)

## 2012-03-02 LAB — LDL CHOLESTEROL, DIRECT: Direct LDL: 148.8 mg/dL

## 2012-03-08 ENCOUNTER — Encounter: Payer: Self-pay | Admitting: Internal Medicine

## 2012-03-08 ENCOUNTER — Ambulatory Visit (INDEPENDENT_AMBULATORY_CARE_PROVIDER_SITE_OTHER): Payer: BC Managed Care – PPO | Admitting: Internal Medicine

## 2012-03-08 VITALS — BP 126/80 | HR 64 | Temp 98.6°F | Resp 16 | Ht 71.0 in | Wt 178.0 lb

## 2012-03-08 DIAGNOSIS — Z0000 Encounter for general adult medical examination without abnormal findings: Secondary | ICD-10-CM

## 2012-03-08 NOTE — Patient Instructions (Addendum)
The patient is instructed to continue all medications as prescribed. Schedule followup with check out clerk upon leaving the clinic  

## 2012-03-08 NOTE — Progress Notes (Signed)
  Subjective:    Patient ID: Joseph Boyle, male    DOB: 21-Oct-1960, 51 y.o.   MRN: [id number]  HPI  CPX  Review of Systems  Constitutional: Negative for fever and fatigue.  HENT: Negative for hearing loss, congestion, neck pain and postnasal drip.   Eyes: Negative for discharge, redness and visual disturbance.  Respiratory: Negative for cough, shortness of breath and wheezing.   Cardiovascular: Negative for leg swelling.  Gastrointestinal: Negative for abdominal pain, constipation and abdominal distention.  Genitourinary: Negative for urgency and frequency.  Musculoskeletal: Negative for joint swelling and arthralgias.  Skin: Negative for color change and rash.  Neurological: Negative for weakness and light-headedness.  Hematological: Negative for adenopathy.  Psychiatric/Behavioral: Negative for behavioral problems.   Past Medical History  Diagnosis Date  . Allergy   . Arthritis     History   Social History  . Marital Status: Married    Spouse Name: N/A    Number of Children: N/A  . Years of Education: N/A   Occupational History  . Not on file.   Social History Main Topics  . Smoking status: Never Smoker   . Smokeless tobacco: Not on file  . Alcohol Use:   . Drug Use:   . Sexually Active: Yes   Other Topics Concern  . Not on file   Social History Narrative  . No narrative on file    Past Surgical History  Procedure Date  . Rotator cuff repair   . Rotator cuff repair 2007    Family History  Problem Relation Age of Onset  . Hypertension Mother   . Hypertension Brother     Allergies  Allergen Reactions  . Doxycycline     rash    Current Outpatient Prescriptions on File Prior to Visit  Medication Sig Dispense Refill  . Beclomethasone Dipropionate (QNASL) 80 MCG/ACT AERS Place 2 sprays into the nose daily.        BP 126/80  Pulse 64  Temp 98.6 F (37 C)  Resp 16  Ht 5\' 11"  (1.803 m)  Wt 178 lb (80.74 kg)  BMI 24.83 kg/m2          Objective:   Physical Exam  Nursing note and vitals reviewed. Constitutional: He is oriented to person, place, and time. He appears well-developed and well-nourished.  HENT:  Head: Normocephalic and atraumatic.  Eyes: Conjunctivae normal are normal. Pupils are equal, round, and reactive to light.  Neck: Normal range of motion. Neck supple.  Cardiovascular: Normal rate and regular rhythm.   Pulmonary/Chest: Effort normal and breath sounds normal.  Abdominal: Soft. Bowel sounds are normal.  Genitourinary: Rectum normal and prostate normal.  Musculoskeletal: Normal range of motion.  Neurological: He is alert and oriented to person, place, and time.  Skin: Skin is warm and dry.  Psychiatric: He has a normal mood and affect. His behavior is normal.          Assessment & Plan:   Patient presents for yearly preventative medicine examination.   all immunizations and health maintenance protocols were reviewed with the patient and they are up to date with these protocols.   screening laboratory values were reviewed with the patient including screening of hyperlipidemia PSA renal function and hepatic function.   There medications past medical history social history problem list and allergies were reviewed in detail.   Goals were established with regard to weight loss exercise diet in compliance with medications

## 2012-09-05 ENCOUNTER — Ambulatory Visit: Payer: BC Managed Care – PPO | Admitting: Internal Medicine

## 2012-10-27 ENCOUNTER — Encounter: Payer: Self-pay | Admitting: Internal Medicine

## 2012-10-27 ENCOUNTER — Ambulatory Visit (INDEPENDENT_AMBULATORY_CARE_PROVIDER_SITE_OTHER): Payer: BC Managed Care – PPO | Admitting: Internal Medicine

## 2012-10-27 VITALS — BP 104/70 | HR 72 | Temp 98.6°F | Resp 16 | Ht 71.0 in | Wt 180.0 lb

## 2012-10-27 DIAGNOSIS — J309 Allergic rhinitis, unspecified: Secondary | ICD-10-CM

## 2012-10-27 NOTE — Assessment & Plan Note (Signed)
Seasonal allergies Did not use the power steroid spray Has stable allergies

## 2012-10-27 NOTE — Patient Instructions (Signed)
Discussion of limiting gluten ( bread and pasta)

## 2012-10-27 NOTE — Progress Notes (Signed)
  Subjective:    Patient ID: Joseph Boyle, male    DOB: 1961-03-01, 52 y.o.   MRN: [id number]  HPI  Is a 52 year old Latin American male who follows for seasonal allergies.  He was placed on a nasal inhaler at the time his last office visit which he states he did not use his allergy symptoms have largely resolved and he states that with spring allergy season he did not have any significant issues  Review of Systems  Constitutional: Negative for fever and fatigue.  HENT: Negative for hearing loss, congestion, neck pain and postnasal drip.   Eyes: Negative for discharge, redness and visual disturbance.  Respiratory: Negative for cough, shortness of breath and wheezing.   Cardiovascular: Negative for leg swelling.  Gastrointestinal: Negative for abdominal pain, constipation and abdominal distention.  Genitourinary: Negative for urgency and frequency.  Musculoskeletal: Negative for joint swelling and arthralgias.  Skin: Negative for color change and rash.  Neurological: Negative for weakness and light-headedness.  Hematological: Negative for adenopathy.  Psychiatric/Behavioral: Negative for behavioral problems.       Objective:   Physical Exam  Constitutional: He appears well-developed and well-nourished.  HENT:  Head: Normocephalic and atraumatic.  Eyes: Conjunctivae are normal. Pupils are equal, round, and reactive to light.  Neck: Normal range of motion. Neck supple.  Cardiovascular: Normal rate and regular rhythm.   Pulmonary/Chest: Effort normal and breath sounds normal.  Abdominal: Soft. Bowel sounds are normal.          Assessment & Plan:  Stable allergic rhinitis.  Discussed weight gain and central pedal weight, gluten-free diet was discussed with patient who presents for complete physical examination 6 months

## 2012-11-08 ENCOUNTER — Telehealth: Payer: Self-pay | Admitting: Internal Medicine

## 2012-11-08 MED ORDER — AZITHROMYCIN 250 MG PO TABS: ORAL_TABLET | ORAL | Status: DC

## 2012-11-08 NOTE — Telephone Encounter (Signed)
Patient Information:  Caller Name: Joseph Boyle  Phone: [phone]  Patient: Joseph Boyle, Joseph Boyle  Gender: Male  DOB: 1960-11-04  Age: 52 Years  PCP: Darryll Capers (Adults only)  Office Follow Up:  Does the office need to follow up with this patient?: Yes  Instructions For The Office: Patient declines appt. Patient is requesting a Rx for Zithromax to be called into [facility] on [address] at [phone]. Patient can be reached at [phone].  RN Note:  Patient states he developed sinus congestion, onset 11/07/12. States green nasal drainage noted. Afebrile. Care advice given per guidelines. Patient advised increased fluids, warm compresses to face, saline nasal washes/Neti Pot, inhaled steam, humidifier.  Call back parameters reviewed. Patient verbalizes understanding. Patient declines appt. Patient is requesting a Rx for Zithromax to be called into [facility] on [address] at [phone]. Patient can be reached at [phone].  Symptoms  Reason For Call & Symptoms: Sinus congestion  Reviewed Health History In EMR: Yes  Reviewed Medications In EMR: Yes  Reviewed Allergies In EMR: Yes  Reviewed Surgeries / Procedures: Yes  Date of Onset of Symptoms: 11/07/2012  Guideline(s) Used:  Sinus Pain and Congestion  Disposition Per Guideline:   Home Care  Reason For Disposition Reached:   Sinus congestion as part of a cold, present < 10 days  Advice Given:  For a Stuffy Nose - Use Nasal Washes:  Introduction: Saline (salt water) nasal irrigation (nasal wash) is an effective and simple home remedy for treating stuffy nose and sinus congestion. The nose can be irrigated by pouring, spraying, or squirting salt water into the nose and then letting it run back out.  How it Helps: The salt water rinses out excess mucus, washes out any irritants (dust, allergens) that might be present, and moistens the nasal cavity.  Methods: There are several ways to perform nasal irrigation.  You can use a saline nasal spray bottle (available over-the-counter), a rubber ear syringe, a medical syringe without the needle, or a Neti Pot.  Hydration:  Drink plenty of liquids (6-8 glasses of water daily). If the air in your home is dry, use a cool mist humidifier  Call Back If:   You become worse.  Patient Refused Recommendation:  Patient Requests Prescription  Patient declines appt. Patient is requesting a Rx for Zithromax to be called into [facility] on [address] at [phone]. Patient can be reached at [phone].

## 2012-11-08 NOTE — Telephone Encounter (Signed)
rx sent and patient is aware 

## 2012-12-01 ENCOUNTER — Ambulatory Visit: Payer: PRIVATE HEALTH INSURANCE | Admitting: Internal Medicine

## 2013-02-22 ENCOUNTER — Telehealth: Payer: Self-pay | Admitting: Internal Medicine

## 2013-02-22 NOTE — Telephone Encounter (Signed)
Patient states that last May when he had cold/sinus, Dr. Lovell Sheehan rx'd him a Zpack. He states he is sick again and would like it refilled. I explained that another doctor will likely not do that, but I would ask. I advised that he may need to see someone. Please advise. [facility] on [facility] pharmacy.

## 2013-02-23 NOTE — Telephone Encounter (Signed)
Arline Asp, I called patient. He declined appt, stating he cannot come today. He wishes [place] to give Dr Lovell Sheehan his request, and see if he will rx the abx.

## 2013-02-23 NOTE — Telephone Encounter (Signed)
Pt needs to see another provider

## 2013-02-26 ENCOUNTER — Telehealth: Payer: Self-pay | Admitting: *Deleted

## 2013-02-26 MED ORDER — AZITHROMYCIN 250 MG PO TABS: ORAL_TABLET | ORAL | Status: DC

## 2013-02-26 NOTE — Telephone Encounter (Signed)
done

## 2013-02-26 NOTE — Telephone Encounter (Signed)
Pt informed

## 2013-02-26 NOTE — Telephone Encounter (Signed)
rx sent in notify patient

## 2013-04-23 ENCOUNTER — Other Ambulatory Visit (INDEPENDENT_AMBULATORY_CARE_PROVIDER_SITE_OTHER): Payer: BC Managed Care – PPO

## 2013-04-23 DIAGNOSIS — Z0000 Encounter for general adult medical examination without abnormal findings: Secondary | ICD-10-CM

## 2013-04-23 LAB — POCT URINALYSIS DIPSTICK
Bilirubin, UA: NEGATIVE
Glucose, UA: NEGATIVE
Ketones, UA: NEGATIVE
Leukocytes, UA: NEGATIVE
Nitrite, UA: NEGATIVE
Protein, UA: NEGATIVE
pH, UA: 6

## 2013-04-23 LAB — BASIC METABOLIC PANEL
CO2: 30 mEq/L (ref 19–32)
Chloride: 106 mEq/L (ref 96–112)
Glucose, Bld: 87 mg/dL (ref 70–99)
Potassium: 4.2 mEq/L (ref 3.5–5.1)
Sodium: 140 mEq/L (ref 135–145)

## 2013-04-23 LAB — CBC WITH DIFFERENTIAL/PLATELET
Basophils Absolute: 0 10*3/uL (ref 0.0–0.1)
Eosinophils Relative: 3.6 % (ref 0.0–5.0)
HCT: 44.1 % (ref 39.0–52.0)
Lymphocytes Relative: 33.7 % (ref 12.0–46.0)
Lymphs Abs: 1.2 10*3/uL (ref 0.7–4.0)
Monocytes Relative: 9.4 % (ref 3.0–12.0)
Platelets: 205 10*3/uL (ref 150.0–400.0)
RDW: 13.9 % (ref 11.5–14.6)
WBC: 3.6 10*3/uL — ABNORMAL LOW (ref 4.5–10.5)

## 2013-04-23 LAB — HEPATIC FUNCTION PANEL
ALT: 20 U/L (ref 0–53)
Bilirubin, Direct: 0.2 mg/dL (ref 0.0–0.3)
Total Bilirubin: 1.7 mg/dL — ABNORMAL HIGH (ref 0.3–1.2)
Total Protein: 6.6 g/dL (ref 6.0–8.3)

## 2013-04-23 LAB — LIPID PANEL
HDL: 54.4 mg/dL (ref >39.00)
VLDL: 7 mg/dL (ref 0.0–40.0)

## 2013-04-23 LAB — TSH: TSH: 1.96 u[IU]/mL (ref 0.35–5.50)

## 2013-04-23 LAB — PSA: PSA: 0.68 ng/mL (ref 0.10–4.00)

## 2013-04-30 ENCOUNTER — Encounter: Payer: Self-pay | Admitting: Internal Medicine

## 2013-04-30 ENCOUNTER — Ambulatory Visit (INDEPENDENT_AMBULATORY_CARE_PROVIDER_SITE_OTHER): Payer: BC Managed Care – PPO | Admitting: Internal Medicine

## 2013-04-30 VITALS — BP 120/78 | HR 64 | Temp 98.0°F | Resp 16 | Ht 71.0 in | Wt 182.0 lb

## 2013-04-30 DIAGNOSIS — Z0000 Encounter for general adult medical examination without abnormal findings: Secondary | ICD-10-CM

## 2013-04-30 NOTE — Progress Notes (Signed)
  Subjective:    Patient ID: Joseph Boyle, male    DOB: Apr 19, 1961, 52 y.o.   MRN: [id number]  HPI CPX Yearly exam stable Labs in advance   Review of Systems  Constitutional: Negative for fever and fatigue.  HENT: Negative for congestion, hearing loss and postnasal drip.   Eyes: Negative for discharge, redness and visual disturbance.  Respiratory: Negative for cough, shortness of breath and wheezing.   Cardiovascular: Negative for leg swelling.  Gastrointestinal: Negative for abdominal pain, constipation and abdominal distention.  Genitourinary: Negative for urgency and frequency.  Musculoskeletal: Negative for arthralgias, joint swelling and neck pain.  Skin: Negative for color change and rash.  Neurological: Negative for weakness and light-headedness.  Hematological: Negative for adenopathy.  Psychiatric/Behavioral: Negative for behavioral problems.   Past Medical History  Diagnosis Date  . Allergy   . Arthritis     History   Social History  . Marital Status: Married    Spouse Name: N/A    Number of Children: N/A  . Years of Education: N/A   Occupational History  . Not on file.   Social History Main Topics  . Smoking status: Never Smoker   . Smokeless tobacco: Not on file  . Alcohol Use: Not on file  . Drug Use: Not on file  . Sexual Activity: Yes   Other Topics Concern  . Not on file   Social History Narrative  . No narrative on file    Past Surgical History  Procedure Laterality Date  . Rotator cuff repair    . Rotator cuff repair  2007    Family History  Problem Relation Age of Onset  . Hypertension Mother   . Hypertension Brother     Allergies  Allergen Reactions  . Doxycycline     rash    No current outpatient prescriptions on file prior to visit.   No current facility-administered medications on file prior to visit.    BP 120/78  Pulse 64  Temp(Src) 98 F (36.7 C)  Resp 16  Ht 5\' 11"  (1.803 m)  Wt 182 lb (82.555 kg)  BMI  25.40 kg/m2       Objective:   Physical Exam  Nursing note reviewed. Constitutional: He is oriented to person, place, and time. He appears well-developed and well-nourished.  HENT:  Head: Normocephalic and atraumatic.  Eyes: Conjunctivae are normal. Pupils are equal, round, and reactive to light.  Neck: Normal range of motion. Neck supple.  Cardiovascular: Normal rate and regular rhythm.   Pulmonary/Chest: Effort normal and breath sounds normal.  Abdominal: Soft. Bowel sounds are normal.  Genitourinary: Rectum normal and prostate normal.  Musculoskeletal: Normal range of motion.  Neurological: He is alert and oriented to person, place, and time.  Skin: Skin is warm and dry.  Psychiatric: He has a normal mood and affect. His behavior is normal.          Assessment & Plan:   Patient presents for yearly preventative medicine examination.   all immunizations and health maintenance protocols were reviewed with the patient and they are up to date with these protocols.   screening laboratory values were reviewed with the patient including screening of hyperlipidemia PSA renal function and hepatic function.   There medications past medical history social history problem list and allergies were reviewed in detail.   Goals were established with regard to weight loss exercise diet in compliance with medications

## 2013-04-30 NOTE — Patient Instructions (Signed)
The patient is instructed to continue all medications as prescribed. Schedule followup with check out clerk upon leaving the clinic  

## 2013-04-30 NOTE — Progress Notes (Signed)
Pre-visit discussion using our clinic review tool. No additional management support is needed unless otherwise documented below in the visit note.  

## 2013-10-15 ENCOUNTER — Telehealth: Payer: Self-pay | Admitting: Internal Medicine

## 2013-10-15 MED ORDER — BENZONATATE 100 MG PO CAPS: 100.0000 mg | ORAL_CAPSULE | Freq: Three times a day (TID) | ORAL | Status: DC | PRN

## 2013-10-15 NOTE — Telephone Encounter (Signed)
Per Dr Lovell SheehanJenkins call in tessalon 100 mg tid prn #20/0 refills, [facility], pt aware

## 2013-10-15 NOTE — Telephone Encounter (Signed)
Pt has cough and decline to see another provider. Pt is aware md will be leaving

## 2014-03-01 ENCOUNTER — Ambulatory Visit (INDEPENDENT_AMBULATORY_CARE_PROVIDER_SITE_OTHER): Payer: Self-pay | Admitting: Physician Assistant

## 2014-03-01 ENCOUNTER — Encounter: Payer: Self-pay | Admitting: Physician Assistant

## 2014-03-01 VITALS — BP 118/80 | HR 60 | Temp 97.8°F | Resp 18 | Wt 181.9 lb

## 2014-03-01 DIAGNOSIS — IMO0001 Reserved for inherently not codable concepts without codable children: Secondary | ICD-10-CM

## 2014-03-01 DIAGNOSIS — L03019 Cellulitis of unspecified finger: Secondary | ICD-10-CM

## 2014-03-01 MED ORDER — CEPHALEXIN 500 MG PO CAPS: 500.0000 mg | ORAL_CAPSULE | Freq: Two times a day (BID) | ORAL | Status: DC

## 2014-03-01 NOTE — Progress Notes (Signed)
Subjective:    Patient ID: Joseph Boyle, male    DOB: [birth year], 53 y.o.   MRN: [id number]  HPI Patient is a 53 y.o. male presenting for finger infection. PT states he noticed this start developing about 3 days ago. Gradually worsened. He has noticed swelling and redness, as well as yellow discoloration around the nail. He has not tried anything at home for this. Patient denies fevers, chills, nausea, vomiting, diarrhea, shortness of breath, chest pain, headache, syncope.   Review of Systems As per HPI and are otherwise negative.    Past Medical History  Diagnosis Date  . Allergy   . Arthritis     History   Social History  . Marital Status: Married    Spouse Name: N/A    Number of Children: N/A  . Years of Education: N/A   Occupational History  . Not on file.   Social History Main Topics  . Smoking status: Never Smoker   . Smokeless tobacco: Not on file  . Alcohol Use: Not on file  . Drug Use: Not on file  . Sexual Activity: Yes   Other Topics Concern  . Not on file   Social History Narrative  . No narrative on file    Past Surgical History  Procedure Laterality Date  . Rotator cuff repair    . Rotator cuff repair  2007    Family History  Problem Relation Age of Onset  . Hypertension Mother   . Hypertension Brother     Allergies  Allergen Reactions  . Doxycycline     rash    No current outpatient prescriptions on file prior to visit.   No current facility-administered medications on file prior to visit.    EXAM: BP 118/80  Pulse 60  Temp(Src) 97.8 F (36.6 C) (Oral)  Resp 18  Wt 181 lb 14.4 oz (82.509 kg)     Objective:   Physical Exam  Nursing note and vitals reviewed. Constitutional: He is oriented to person, place, and time. He appears well-developed and well-nourished. No distress.  HENT:  Head: Normocephalic and atraumatic.  Eyes: Conjunctivae and EOM are normal.  Cardiovascular: Normal rate, regular rhythm and intact distal  pulses.   Pulmonary/Chest: Effort normal and breath sounds normal. No respiratory distress. He has no wheezes. He has no rales. He exhibits no tenderness.  Musculoskeletal: Normal range of motion.  Neurological: He is alert and oriented to person, place, and time.  Skin: Skin is warm and dry. No rash noted. He is not diaphoretic. There is erythema. No pallor.  The is a local area of erythema and swelling and fluctuance of the distal,lateral 3rd finger near the nail fold on the right hand. This is warm to the touch and ttp.  Psychiatric: He has a normal mood and affect. His behavior is normal. Judgment and thought content normal.    Lab Results  Component Value Date   WBC 3.6* 04/23/2013   HGB 14.8 04/23/2013   HCT 44.1 04/23/2013   PLT 205.0 04/23/2013   GLUCOSE 87 04/23/2013   CHOL 192 04/23/2013   TRIG 35.0 04/23/2013   HDL 54.40 04/23/2013   LDLDIRECT 148.8 03/01/2012   LDLCALC 131* 04/23/2013   ALT 20 04/23/2013   AST 24 04/23/2013   NA 140 04/23/2013   K 4.2 04/23/2013   CL 106 04/23/2013   CREATININE 1.1 04/23/2013   BUN 14 04/23/2013   CO2 30 04/23/2013   TSH 1.96 04/23/2013   PSA 0.68  04/23/2013         Assessment & Plan:  Ura was seen today for finger infection.  Diagnoses and associated orders for this visit:  Paronychia of third finger of right hand Comments: ID in office, cover with Keflex, soaks, bacitracin oinment and plain dandage. watchful waiting. - cephALEXin (KEFLEX) 500 MG capsule; Take 1 capsule (500 mg total) by mouth 2 (two) times daily.    Procedure note:  Incision and Drainage of an Abscess of Paronychia.  Indication : a localized collection of pus that is tender and not spontaneously resolving.  Risks including unsuccessful procedure , possible need for a repeat procedure due to pus accumulation, scar formation, and others as well as benefits were explained to the patient in detail. Verbal informed consent was obtained.   The finger and area of the  paronychia was prepped with povidone-iodine and draped in a sterile fashion. Local anesthesia with 2cc of 1% lidocaine was administered in a digital block.  0.5 cm incision with #11strait blade was made. About 1.5 cc of purulent material was expressed.   The wound was dressed with antibiotic ointment and plain bandage. Tolerated well. Complications: None.  Wound instructions provided.  Return precautions provided, and patient handout on paronychia.  Plan to follow up as needed, or for worsening or persistent symptoms despite treatment.  Patient Instructions  Keflex twice daily for 7 days for infection.  Keep the area covered with a bandaid to help healing.   Wash the area with antibacteria soap and water.   You can apply a small amount of Bacitracin ointment over the area through the weekend to prevent re-infection.  Soak the finger in warm water with epsom salt at night to help recovery.  If emergency symptoms discussed during visit developed, seek medical attention immediately.  Followup as needed, or for worsening or persistent symptoms despite treatment.

## 2014-03-01 NOTE — Patient Instructions (Addendum)
Keflex twice daily for 7 days for infection.  Keep the area covered with a bandaid to help healing.   Wash the area with antibacteria soap and water.   You can apply a small amount of Bacitracin ointment over the area through the weekend to prevent re-infection.  Soak the finger in warm water with epsom salt at night to help recovery.  If emergency symptoms discussed during visit developed, seek medical attention immediately.  Followup as needed, or for worsening or persistent symptoms despite treatment.    Paronychia  Paronychia is an infection of the skin caused by germs. It happens by the fingernail or toenail. You can avoid it by not:  Pulling on hangnails.  Nail biting.  Thumb sucking.  Cutting fingernails and toenails too short.  Cutting the skin at the base and sides of the fingernail or toenail (cuticle). HOME CARE  Keep the fingers or toes very dry. Put rubber gloves over cotton gloves when putting hands in water.  Keep the wound clean and bandaged (dressed) as told by your doctor.  Soak the fingers or toes in warm water for 15 to 20 minutes. Soak them 3 to 4 times per day for germ infections. Fungal infections are difficult to treat. Fungal infections often require treatment for a long time.  Only take medicine as told by your doctor. GET HELP RIGHT AWAY IF:   You have redness, puffiness (swelling), or pain that gets worse.  You see yellowish-white fluid (pus) coming from the wound.  You have a fever.  You have a bad smell coming from the wound or bandage. MAKE SURE YOU:  Understand these instructions.  Will watch your condition.  Will get help if you are not doing well or get worse. Document Released: 05/26/2009 Document Revised: 08/30/2011 Document Reviewed: 05/26/2009 [facility] Patient Information 2015 [place], [place]. This information is not intended to replace advice given to you by your health care provider. Make sure you discuss any questions you  have with your health care provider.

## 2014-03-28 ENCOUNTER — Encounter: Payer: Self-pay | Admitting: Family Medicine

## 2014-03-28 ENCOUNTER — Ambulatory Visit (INDEPENDENT_AMBULATORY_CARE_PROVIDER_SITE_OTHER): Payer: 59 | Admitting: Family Medicine

## 2014-03-28 VITALS — BP 130/82 | HR 56 | Temp 97.7°F | Ht 71.0 in | Wt 186.0 lb

## 2014-03-28 DIAGNOSIS — E785 Hyperlipidemia, unspecified: Secondary | ICD-10-CM

## 2014-03-28 DIAGNOSIS — Z0000 Encounter for general adult medical examination without abnormal findings: Secondary | ICD-10-CM

## 2014-03-28 DIAGNOSIS — IMO0001 Reserved for inherently not codable concepts without codable children: Secondary | ICD-10-CM

## 2014-03-28 LAB — COMPREHENSIVE METABOLIC PANEL
ALT: 26 U/L (ref 0–53)
AST: 29 U/L (ref 0–37)
Albumin: 3.8 g/dL (ref 3.5–5.2)
Alkaline Phosphatase: 57 U/L (ref 39–117)
BUN: 17 mg/dL (ref 6–23)
CALCIUM: 9.3 mg/dL (ref 8.4–10.5)
CHLORIDE: 103 meq/L (ref 96–112)
CO2: 28 mEq/L (ref 19–32)
Creatinine, Ser: 1.2 mg/dL (ref 0.4–1.5)
GFR: 84.01 mL/min (ref >60.00)
Glucose, Bld: 94 mg/dL (ref 70–99)
POTASSIUM: 4.3 meq/L (ref 3.5–5.1)
SODIUM: 140 meq/L (ref 135–145)
Total Bilirubin: 1.7 mg/dL — ABNORMAL HIGH (ref 0.2–1.2)
Total Protein: 7.5 g/dL (ref 6.0–8.3)

## 2014-03-28 LAB — LIPID PANEL
CHOL/HDL RATIO: 5
Cholesterol: 232 mg/dL — ABNORMAL HIGH (ref 0–200)
HDL: 48.5 mg/dL (ref >39.00)
LDL Cholesterol: 175 mg/dL — ABNORMAL HIGH (ref 0–99)
NONHDL: 183.5
Triglycerides: 44 mg/dL (ref 0.0–149.0)
VLDL: 8.8 mg/dL (ref 0.0–40.0)

## 2014-03-28 LAB — CBC
HCT: 46.6 % (ref 39.0–52.0)
Hemoglobin: 15.2 g/dL (ref 13.0–17.0)
MCHC: 32.6 g/dL (ref 30.0–36.0)
MCV: 95 fl (ref 78.0–100.0)
Platelets: 195 10*3/uL (ref 150.0–400.0)
RBC: 4.9 Mil/uL (ref 4.22–5.81)
RDW: 13.7 % (ref 11.5–15.5)
WBC: 4 10*3/uL (ref 4.0–10.5)

## 2014-03-28 LAB — PSA: PSA: 0.57 ng/mL (ref 0.10–4.00)

## 2014-03-28 LAB — TSH: TSH: 1.85 u[IU]/mL (ref 0.35–4.50)

## 2014-03-28 NOTE — Assessment & Plan Note (Signed)
Check lipids today and calculate 10 year risk. Patient states he would not take medicine for this even if elevated.

## 2014-03-28 NOTE — Patient Instructions (Signed)
53 y.o. male presenting for annual physical.  Health Maintenance counseling: 1. Anticipatory guidance: Patient counseled regarding regular dental exams, wearing seatbelts.  2. Risk factor reduction:  Advised patient of need for regular exercise and diet rich and fruits and vegetables to reduce risk of heart attack and stroke. Patient is already eating well and exercising regularly. At healthy weight. Athletic pulse.  3. Immunizations/screenings/ancillary studies Health Maintenance Due  Topic Date Due  . Influenza Vaccine -declined 01/19/2014

## 2014-03-28 NOTE — Progress Notes (Addendum)
Tana ConchStephen Antigone Crowell, MD Phone: [phone]  Subjective:  Patient presents today to establish care with me as their new primary care provider and for annual physical.  Chief complaint-noted.   Hyperlipidemia-poor control  Lab Results  Component Value Date   LDLCALC 131* 04/23/2013  On statin: no Calculate 10 year risk with next labs.  Regular exercise: tennis 3x a week.  Diet: odd diet, eats once a day ROS- no chest pain or shortness of breath. No myalgias  Otherwise patient is doing well. He would like to be closer to 175-180 and is going to increase his tennis to get there. He eats once a day but states this works for him. Patient denies medial meniscus injury or history shoulder surgery-updated and stated patient denies these in history. He thinks the wrong charts were merged perhaps.   The following were reviewed and entered/updated in epic: Past Medical History  Diagnosis Date  . Allergy   . Arthritis     patient denies  . TEAR, MEDIAL MENISCUS, KNEE, CURRENT 03/30/2007    patient denies   Patient Active Problem List   Diagnosis Date Noted  . Hyperlipidemia 03/28/2014  . ALLERGIC RHINITIS 01/17/2007   Past Surgical History  Procedure Laterality Date  . Rotator cuff repair  2007    Patient denies  . Cyst removal trunk      around 2005    Family History  Problem Relation Age of Onset  . Hypertension Mother   . Hypertension Brother     Medications- reviewed and updated. No current medications  Allergies-reviewed and updated Allergies  Allergen Reactions  . Doxycycline     rash    History   Social History  . Marital Status: Married    Spouse Name: N/A    Number of Children: N/A  . Years of Education: N/A   Social History Main Topics  . Smoking status: Never Smoker   . Smokeless tobacco: None  . Alcohol Use: No  . Drug Use: No  . Sexual Activity: Yes   Other Topics Concern  . None   Social History Narrative   Married 1987. Wife Dianna. 2  kids-[place]anish[place] lives in [place] no kids. [place] 481 kid-55 years old.       Works as AdministratorHeating and air conditioning tech.       Hobbies: tennis, fishing      ROS--See HPI   Objective: BP 130/82  Pulse 56  Temp(Src) 97.7 F (36.5 C)  Ht 5\' 11"  (1.803 m)  Wt 186 lb (84.369 kg)  BMI 25.95 kg/m2 Gen: NAD, resting comfortably on table, athletic appearing for age.  HEENT: Mucous membranes are moist. Oropharynx normal. Multiple filled caries.  Neck: no thyromegaly CV: RRR no murmurs rubs or gallops Lungs: CTAB no crackles, wheeze, rhonchi Abdomen: soft/nontender/nondistended/normal bowel sounds. No rebound or guarding.  Ext: no edema Rectal: normal prostate exam Skin: warm, dry Neuro: grossly normal, moves all extremities, PERRLA  Assessment/Plan:  53 y.o. male presenting for annual physical.  Health Maintenance counseling: 1. Anticipatory guidance: Patient counseled regarding regular dental exams, wearing seatbelts.  2. Risk factor reduction:  Advised patient of need for regular exercise and diet rich and fruits and vegetables to reduce risk of heart attack and stroke.  3. Immunizations/screenings/ancillary studies Health Maintenance Due  Topic Date Due  . Influenza Vaccine -declined 01/19/2014  4. Discussed USPTF advice on prostate cancer screening. Patient opts for yearly rectal and PSA testing.  5. Update labs today.  Hyperlipidemia Check lipids today and calculate 10 year risk. Patient states he would not take medicine for this even if elevated.   fasting Orders Placed This Encounter  Procedures  . CBC    [place]  . Comprehensive metabolic panel    [place]  . Lipid panel    [place]  . TSH    [place]  . PSA

## 2014-04-23 ENCOUNTER — Other Ambulatory Visit: Payer: BC Managed Care – PPO

## 2014-05-03 ENCOUNTER — Encounter: Payer: BC Managed Care – PPO | Admitting: Internal Medicine

## 2014-07-02 ENCOUNTER — Emergency Department (HOSPITAL_BASED_OUTPATIENT_CLINIC_OR_DEPARTMENT_OTHER)
Admission: EM | Admit: 2014-07-02 | Discharge: 2014-07-02 | Disposition: A | Discharge status: Home / Self Care | Payer: 59 | Attending: Emergency Medicine | Admitting: Emergency Medicine

## 2014-07-02 ENCOUNTER — Encounter: Payer: Self-pay | Admitting: Family Medicine

## 2014-07-02 ENCOUNTER — Encounter (HOSPITAL_BASED_OUTPATIENT_CLINIC_OR_DEPARTMENT_OTHER): Payer: Self-pay | Admitting: *Deleted

## 2014-07-02 ENCOUNTER — Ambulatory Visit (INDEPENDENT_AMBULATORY_CARE_PROVIDER_SITE_OTHER): Payer: 59 | Admitting: Family Medicine

## 2014-07-02 VITALS — BP 110/78 | HR 79 | Temp 97.4°F | Ht 71.0 in | Wt 191.3 lb

## 2014-07-02 DIAGNOSIS — L089 Local infection of the skin and subcutaneous tissue, unspecified: Secondary | ICD-10-CM

## 2014-07-02 DIAGNOSIS — Z8739 Personal history of other diseases of the musculoskeletal system and connective tissue: Secondary | ICD-10-CM | POA: Insufficient documentation

## 2014-07-02 DIAGNOSIS — L02415 Cutaneous abscess of right lower limb: Secondary | ICD-10-CM | POA: Diagnosis present

## 2014-07-02 MED ORDER — LIDOCAINE HCL (PF) 1 % IJ SOLN
30.0000 mL | Freq: Once | INTRAMUSCULAR | Status: AC
  Administered 2014-07-02: 30 mL
  Filled 2014-07-02: qty 30

## 2014-07-02 MED ORDER — IBUPROFEN 800 MG PO TABS: 800.0000 mg | ORAL_TABLET | Freq: Three times a day (TID) | ORAL | Status: DC

## 2014-07-02 MED ORDER — SULFAMETHOXAZOLE-TRIMETHOPRIM 800-160 MG PO TABS: 1.0000 | ORAL_TABLET | Freq: Two times a day (BID) | ORAL | Status: DC

## 2014-07-02 NOTE — Progress Notes (Signed)
Pre visit review using our clinic review tool, if applicable. No additional management support is needed unless otherwise documented below in the visit note. 

## 2014-07-02 NOTE — Discharge Instructions (Signed)

## 2014-07-02 NOTE — ED Provider Notes (Signed)
CSN: [id number]     Arrival date & time 07/02/14  1644 History   First MD Initiated Contact with Patient 07/02/14 1708     Chief Complaint  Patient presents with  . Abscess     (Consider location/radiation/quality/duration/timing/severity/associated sxs/prior Treatment) Patient is a 54 y.o. male presenting with abscess. The history is provided by the patient. No language interpreter was used.  Abscess Location:  Leg Leg abscess location:  R leg Abscess quality: draining, painful and redness   Duration:  2 weeks Associated symptoms: no fever and no nausea   Associated symptoms comment:  Painful swollen area to anterior right thigh that started as a "hair bump" that the patient attempted to open over the last 2 weeks that is now larger, red, and more painful. No fever.    Past Medical History  Diagnosis Date  . Allergy   . Arthritis     patient denies  . TEAR, MEDIAL MENISCUS, KNEE, CURRENT 03/30/2007    patient denies   Past Surgical History  Procedure Laterality Date  . Rotator cuff repair  2007    Patient denies  . Cyst removal trunk      around 2005   Family History  Problem Relation Age of Onset  . Hypertension Mother   . Hypertension Brother    History  Substance Use Topics  . Smoking status: Never Smoker   . Smokeless tobacco: Not on file  . Alcohol Use: No    Review of Systems  Constitutional: Negative for fever.  Gastrointestinal: Negative for nausea.  Musculoskeletal: Negative for myalgias.  Skin: Positive for color change and wound.      Allergies  Review of patient's allergies indicates no known allergies.  Home Medications   Prior to Admission medications   Not on File   BP 143/80 mmHg  Pulse 73  Temp(Src) 97.8 F (36.6 C) (Oral)  Resp 16  Ht 5\' 11"  (1.803 m)  Wt 190 lb (86.183 kg)  BMI 26.51 kg/m2  SpO2 100% Physical Exam  Constitutional: He is oriented to person, place, and time. He appears well-developed and well-nourished.   Neck: Normal range of motion.  Pulmonary/Chest: Effort normal.  Musculoskeletal: Normal range of motion.  Neurological: He is alert and oriented to person, place, and time.  Skin: Skin is warm and dry.  Anterior right thigh has large area that is swollen, fluctuant area with small central ulceration. There is erythema that surrounds the area to about 4 cm.   Psychiatric: He has a normal mood and affect.    ED Course  Procedures (including critical care time) Labs Review Labs Reviewed - No data to display  Imaging Review No results found.   EKG Interpretation None     INCISION AND DRAINAGE Performed by: Elpidio AnisUPSTILL, Torianne Laflam A Consent: Verbal consent obtained. Risks and benefits: risks, benefits and alternatives were discussed Type: abscess  Body area: right thigh  Anesthesia: local infiltration  Incision was made with an 11 blade scalpel.  Local anesthetic: lidocaine 1% w/o epinephrine  Anesthetic total: 2 ml  Complexity: complex Blunt dissection to break up loculations, area irrigated with NS  Drainage: purulent  Drainage amount: small, purulent, bloody  Packing material: 1/4 in iodoform gauze  Patient tolerance: Patient tolerated the procedure well with no immediate complications.    MDM   Final diagnoses:  None    1. Cutaneous abscess  Uncomplicated cutaneous abscess with I&D performed. Return instructions provided. Will place on Septra secondary to surrounding redness c/w cellulitis.  Arnoldo Hooker, PA-C 07/02/14 1802  Mirian Mo, MD 07/03/14 Ebony Cargo

## 2014-07-02 NOTE — Progress Notes (Signed)
  HPI:  Rash: -started about 1-2 weeks ago after picking at an infected hair follicle -on R leg -he has been putting peroxide and neosporin on this but getting worse, painful -denies: fevers, chills, malaise  ROS: See pertinent positives and negatives per HPI.  Past Medical History  Diagnosis Date  . Allergy   . Arthritis     patient denies  . TEAR, MEDIAL MENISCUS, KNEE, CURRENT 03/30/2007    patient denies    Past Surgical History  Procedure Laterality Date  . Rotator cuff repair  2007    Patient denies  . Cyst removal trunk      around 2005    Family History  Problem Relation Age of Onset  . Hypertension Mother   . Hypertension Brother     History   Social History  . Marital Status: Married    Spouse Name: N/A    Number of Children: N/A  . Years of Education: N/A   Social History Main Topics  . Smoking status: Never Smoker   . Smokeless tobacco: None  . Alcohol Use: No  . Drug Use: No  . Sexual Activity: Yes   Other Topics Concern  . None   Social History Narrative   Married 1987. Wife Dianna. 2 kids-[place]anisha lives in [place] no kids. [place] with 271 kid-56 years old.       Works as AdministratorHeating and air conditioning tech.       Hobbies: tennis, fishing      No current outpatient prescriptions on file.  EXAM:  Filed Vitals:   07/02/14 1612  BP: 110/78  Pulse: 79  Temp: 97.4 F (36.3 C)    Body mass index is 26.69 kg/(m^2).  GENERAL: vitals reviewed and listed above, alert, oriented, appears well hydrated and in no acute distress  HEENT: atraumatic, conjunttiva clear, no obvious abnormalities on inspection of external nose and ears  SKIN: erythematous indurated area approx 6-7 cm in diameter R ant upper leg with likely underlying abscess; central area of sloughed skin and exposure subcut tissues approx 2-3 cm in diameter; erythema and warmth of skin surrounding this site in larger area  CV: HRRR, no peripheral edema  MS: moves  all extremities without noticeable abnormality  PSYCH: pleasant and cooperative, no obvious depression or anxiety  ASSESSMENT AND PLAN:  Discussed the following assessment and plan:  Skin infection  -abscess/infected wound on leg with sig cellulitis -discussed options including wound debridement/I an D and abx here, but given size he opted eval ED in case surgical eval/IV abx needed -he agreed to go straight there from here -Patient advised to return or notify a doctor immediately if symptoms worsen or persist or new concerns arise.  There are no Patient Instructions on file for this visit.   Kriste BasqueKIM, HANNAH R.

## 2014-07-02 NOTE — ED Notes (Deleted)
d 

## 2014-07-02 NOTE — ED Notes (Signed)
Pt c/o abscss to upper right thigh x 2 weeks, sent here by PMD for eval

## 2014-07-04 ENCOUNTER — Telehealth: Payer: Self-pay | Admitting: *Deleted

## 2014-07-04 NOTE — Telephone Encounter (Signed)
Seems like he is doing well, thankfully.

## 2014-07-04 NOTE — Telephone Encounter (Signed)
I called the pt to see how he was doing today and he states he is feeling better, denies a fever, the redness has decreased, and the area on the leg is a lot smaller and he will see Dr Durene CalHunter tomorrow.

## 2014-07-05 ENCOUNTER — Encounter: Payer: Self-pay | Admitting: Family Medicine

## 2014-07-05 ENCOUNTER — Ambulatory Visit (INDEPENDENT_AMBULATORY_CARE_PROVIDER_SITE_OTHER): Payer: 59 | Admitting: Family Medicine

## 2014-07-05 VITALS — BP 130/60 | Temp 98.8°F | Wt 192.0 lb

## 2014-07-05 DIAGNOSIS — L03119 Cellulitis of unspecified part of limb: Secondary | ICD-10-CM

## 2014-07-05 DIAGNOSIS — L02419 Cutaneous abscess of limb, unspecified: Secondary | ICD-10-CM

## 2014-07-05 NOTE — Progress Notes (Signed)
  Tana ConchStephen Kachina Niederer, MD Phone: [phone]  Subjective:   Joseph Boyle is a 54 y.o. year old very pleasant male patient who presents with the following:  Cellulitis and Abscess Patient was seen in office 07/02/14 and had a large abscess with surrounding cellulitis. There was concern for some necrosis of tissue and patient was sent to ED for further evaluation. In the ED, an I+D was performed and patient sent hom eon bactrim. Patient has done very well since that time, he had some relief after the i+D but the antibiotics seemed within 24 hours to cause significant improvement. He only has some mild localized redness now and pain is much improved. Packing still in place ROS- no fever/chills/nausea/vomiting  Past Medical History- Patient Active Problem List   Diagnosis Date Noted  . Hyperlipidemia 03/28/2014  . ALLERGIC RHINITIS 01/17/2007   Medications- reviewed and updated Current Outpatient Prescriptions  Medication Sig Dispense Refill  . ibuprofen (ADVIL,MOTRIN) 800 MG tablet Take 1 tablet (800 mg total) by mouth 3 (three) times daily. 21 tablet 0  . sulfamethoxazole-trimethoprim (SEPTRA DS) 800-160 MG per tablet Take 1 tablet by mouth every 12 (twelve) hours. 20 tablet 0   Objective: BP 130/60 mmHg  Temp(Src) 98.8 F (37.1 C)  Wt 192 lb (87.091 kg) Gen: NAD, resting comfortably CV: RRR no murmurs rubs or gallops Lungs: CTAB no crackles, wheeze, rhonchi Ext: no edema Indurated area 4.5 x 4.5 without fluctuance Mildly Erythematous area 3x3 with central wound just under a centimeter in length No surrounding erythema     Assessment/Plan:  Cellulitis and Abscess Strongly suspect MRSA, drastic improvement on Bactrim. Strongly encouraged to finish course of antibiotics. Removed packing today and advised aftercare. Gave warning signs for return but likely this will continue to heal and then would will heal by secondary intention.

## 2014-07-05 NOTE — Patient Instructions (Addendum)
Glad you are doing so much better!   Finish your course of antibiotic course  Return to us if fevers, new or expanding redness, worsening pain.

## 2014-07-08 ENCOUNTER — Ambulatory Visit: Payer: 59 | Admitting: Family Medicine

## 2014-07-30 ENCOUNTER — Telehealth: Payer: Self-pay | Admitting: Family Medicine

## 2014-07-30 NOTE — Telephone Encounter (Signed)
Pt called to say that he has questions for Dr Durene CalHunter about his previous visit. He said Dr Durene CalHunter told him to call and he would return the pt call

## 2014-07-30 NOTE — Telephone Encounter (Signed)
Patient did not pick up. LVM. Asked patient to leave details with nursing staff when he calls in.

## 2014-07-30 NOTE — Telephone Encounter (Signed)
Are you aware of this? You saw him for abcess on his leg.

## 2014-08-01 ENCOUNTER — Ambulatory Visit (INDEPENDENT_AMBULATORY_CARE_PROVIDER_SITE_OTHER): Payer: 59 | Admitting: Family Medicine

## 2014-08-01 ENCOUNTER — Encounter: Payer: Self-pay | Admitting: Family Medicine

## 2014-08-01 VITALS — BP 124/72 | Temp 98.4°F | Wt 192.0 lb

## 2014-08-01 DIAGNOSIS — L02511 Cutaneous abscess of right hand: Secondary | ICD-10-CM

## 2014-08-01 MED ORDER — SULFAMETHOXAZOLE-TRIMETHOPRIM 800-160 MG PO TABS: 1.0000 | ORAL_TABLET | Freq: Two times a day (BID) | ORAL | Status: DC

## 2014-08-01 NOTE — Patient Instructions (Signed)
Abscess on your right 2nd finger.  We deroofed it and got some drainage, did look like some deeper drainage but given on the finger we opted not for deeper exploration as already draining some. Change dressing at least twice a day.   Take bactrim for 7 days.   Come back if fevers, worsening swelling or redness or pain

## 2014-08-01 NOTE — Progress Notes (Signed)
  Tana ConchStephen Lynessa Almanzar, MD Phone: [phone]  Subjective:   Joseph Boyle is a 54 y.o. year old very pleasant male patient who presents with the following:  Right 2nd finger erythema, pain, warmth -x 1 week. Thought he just had a small cut that was irritated but over the week the finger continued to become more red, more painful, and developed small pustule on it. He recently had a large abscess on his leg which requred i+D and bactrim to control. Tried some warm water on it but hasn't drained. Some ibuprofen for pain. No other treatments tried. Getting worse.   ROS- no fever/chills/nausea/vomiting  Past Medical History- Patient Active Problem List   Diagnosis Date Noted  . Hyperlipidemia 03/28/2014  . ALLERGIC RHINITIS 01/17/2007   Medications- none prior to visit  Objective: BP 124/72 mmHg  Temp(Src) 98.4 F (36.9 C)  Wt 192 lb (87.091 kg) Gen: NAD, resting comfortably CV: RRR no murmurs rubs or gallops Lungs: CTAB no crackles, wheeze, rhonchi  Ext Right 2nd finger between MTP and PIP with erythema and warmth, central area 3mm pustulre noted. This was deroofed with an 11 blade with purulent drainage noted. There did seem to be a slight tract deeper.   Assessment/Plan:  Abscess right 2nd finger Deroofed with 11 blade with purulent drainage. discussed anesthetic and deeper exploration as well as risks givne on finger and patient declined. Placed on bactrim for 7 days and Return precautions advised. Likely MRSA and thought culture low yield.   Meds ordered this encounter  Medications  . sulfamethoxazole-trimethoprim (SEPTRA DS) 800-160 MG per tablet    Sig: Take 1 tablet by mouth every 12 (twelve) hours.    Dispense:  14 tablet    Refill:  0

## 2014-08-23 ENCOUNTER — Encounter: Payer: Self-pay | Admitting: Internal Medicine

## 2014-08-23 ENCOUNTER — Ambulatory Visit (INDEPENDENT_AMBULATORY_CARE_PROVIDER_SITE_OTHER): Payer: 59 | Admitting: Internal Medicine

## 2014-08-23 VITALS — BP 140/80 | HR 66 | Temp 98.5°F | Resp 20 | Ht 71.0 in | Wt 193.0 lb

## 2014-08-23 DIAGNOSIS — J069 Acute upper respiratory infection, unspecified: Secondary | ICD-10-CM

## 2014-08-23 MED ORDER — FLUTICASONE PROPIONATE 50 MCG/ACT NA SUSP: 2.0000 | Freq: Every day | NASAL | Status: DC

## 2014-08-23 NOTE — Progress Notes (Signed)
Subjective:    Patient ID: Joseph Boyle, male    DOB: 12/18/1960, 54 y.o.   MRN: [id number]  HPI 54 year old patient who presents with a three-day history of hoarseness, mild sore throat and a chief complaint of sinus congestion.  Over the past day or so, he has had some sinus drainage described as green.  There is been no fever, headache, localized dental pain.  He has required treatment recently for some skin and soft tissue infections that have all resolved.  Past Medical History  Diagnosis Date  . Allergy   . Arthritis     patient denies  . TEAR, MEDIAL MENISCUS, KNEE, CURRENT 03/30/2007    patient denies    History   Social History  . Marital Status: Married    Spouse Name: N/A  . Number of Children: N/A  . Years of Education: N/A   Occupational History  . Not on file.   Social History Main Topics  . Smoking status: Never Smoker   . Smokeless tobacco: Not on file  . Alcohol Use: No  . Drug Use: No  . Sexual Activity: Yes   Other Topics Concern  . Not on file   Social History Narrative   Married 1987. Wife Dianna. 2 kids-[place]anish[place] lives in [place] no kids. [place]ashira lives in [place] with 741 kid-688 years old.       Works as AdministratorHeating and air conditioning tech.       Hobbies: tennis, fishing      Past Surgical History  Procedure Laterality Date  . Cyst removal trunk      around 2005    Family History  Problem Relation Age of Onset  . Hypertension Mother   . Hypertension Brother     No Known Allergies  No current outpatient prescriptions on file prior to visit.   No current facility-administered medications on file prior to visit.    BP 140/80 mmHg  Pulse 66  Temp(Src) 98.5 F (36.9 C) (Oral)  Resp 20  Ht 5\' 11"  (1.803 m)  Wt 193 lb (87.544 kg)  BMI 26.93 kg/m2  SpO2 98%      Review of Systems  Constitutional: Positive for activity change, appetite change and fatigue. Negative for fever and chills.  HENT: Positive for congestion, rhinorrhea,  sinus pressure and voice change. Negative for dental problem, ear pain, hearing loss, sore throat, tinnitus and trouble swallowing.   Eyes: Negative for pain, discharge and visual disturbance.  Respiratory: Negative for cough, chest tightness, wheezing and stridor.   Cardiovascular: Negative for chest pain, palpitations and leg swelling.  Gastrointestinal: Negative for nausea, vomiting, abdominal pain, diarrhea, constipation, blood in stool and abdominal distention.  Genitourinary: Negative for urgency, hematuria, flank pain, discharge, difficulty urinating and genital sores.  Musculoskeletal: Negative for myalgias, back pain, joint swelling, arthralgias, gait problem and neck stiffness.  Skin: Negative for rash.  Neurological: Negative for dizziness, syncope, speech difficulty, weakness, numbness and headaches.  Hematological: Negative for adenopathy. Does not bruise/bleed easily.  Psychiatric/Behavioral: Negative for behavioral problems and dysphoric mood. The patient is not nervous/anxious.        Objective:   Physical Exam  Constitutional: He is oriented to person, place, and time. He appears well-developed.  HENT:  Head: Normocephalic.  Right Ear: External ear normal.  Left Ear: External ear normal.  Eyes: Conjunctivae and EOM are normal.  Neck: Normal range of motion.  Cardiovascular: Normal rate and normal heart sounds.   Pulmonary/Chest: Breath sounds normal.  Abdominal:  Bowel sounds are normal.  Musculoskeletal: Normal range of motion. He exhibits no edema or tenderness.  Neurological: He is alert and oriented to person, place, and time.  Psychiatric: He has a normal mood and affect. His behavior is normal.          Assessment & Plan:   Viral rhinosinusitis.  Will treat symptomatically. Patient instructions discussed and dispensed

## 2014-08-23 NOTE — Patient Instructions (Signed)
Acute sinusitis symptoms for less than 10 days are generally not helped by antibiotic therapy.  Use saline irrigation, warm  moist compresses and over-the-counter decongestants only as directed.  Call if there is no improvement in 5 to 7 days, or sooner if you develop increasing pain, fever, or any new symptoms.  TREATMENT  Most cases of acute sinusitis are related to a viral infection and will resolve on their own within 10 days. Sometimes medicines are prescribed to help relieve symptoms (pain medicine, decongestants, nasal steroid sprays, or saline sprays).   HOME CARE INSTRUCTIONS  Drink plenty of water. Water helps thin the mucus so your sinuses can drain more easily.  Use a humidifier.  Inhale steam 3 to 4 times a day (for example, sit in the bathroom with the shower running).  Apply a warm, moist washcloth to your face 3 to 4 times a day, or as directed by your health care provider.  Use saline nasal sprays to help moisten and clean your sinuses. Fluticasone nasal spray as prescribed

## 2014-08-23 NOTE — Progress Notes (Signed)
Pre visit review using our clinic review tool, if applicable. No additional management support is needed unless otherwise documented below in the visit note. 

## 2014-09-27 ENCOUNTER — Encounter: Payer: Self-pay | Admitting: Family Medicine

## 2014-09-27 ENCOUNTER — Ambulatory Visit (INDEPENDENT_AMBULATORY_CARE_PROVIDER_SITE_OTHER): Payer: 59 | Admitting: Family Medicine

## 2014-09-27 VITALS — BP 120/90 | HR 60 | Temp 97.6°F | Wt 190.0 lb

## 2014-09-27 DIAGNOSIS — R05 Cough: Secondary | ICD-10-CM

## 2014-09-27 DIAGNOSIS — R059 Cough, unspecified: Secondary | ICD-10-CM

## 2014-09-27 DIAGNOSIS — G933 Postviral fatigue syndrome: Secondary | ICD-10-CM

## 2014-09-27 DIAGNOSIS — R5383 Other fatigue: Secondary | ICD-10-CM | POA: Diagnosis not present

## 2014-09-27 DIAGNOSIS — G9331 Postviral fatigue syndrome: Secondary | ICD-10-CM

## 2014-09-27 MED ORDER — PREDNISONE 50 MG PO TABS
ORAL_TABLET | ORAL | Status: DC
Start: 2014-09-27 — End: 2015-09-18

## 2014-09-27 NOTE — Progress Notes (Signed)
  Tana ConchStephen Tiffiny Worthy, MD Phone: [phone]  Subjective:   Joseph Boyle is a 54 y.o. year old very pleasant male patient who presents with the following:  Cough > 1 month -Symptoms started approximately a month ago. He was seen in our office for viral URI with symptoms including hoarseness, sore throat, sinus congestion and runny nose. He also had a cough at that time. He took Flonase and some improvement in his runny nose and congestion as well as sore throat but states the cough persisted. He describes a dry cough primarily occasionally with some yellow mucus. He states he has had issues like this in the past with upper respiratory infections. Review of his chart shows that he has used Medrol Dosepak for prednisone in the past. He does believe this helped him previously. He requests another trial this. He is having difficulty resting due to the cough. He has tried over-the-counter cough medicines and cough drops with little relief.  ROS-no chest pain, shortness of breath, fever, sinus pressure.  Past Medical History- Patient Active Problem List   Diagnosis Date Noted  . Hyperlipidemia 03/28/2014    Priority: Medium  . ALLERGIC RHINITIS 01/17/2007    Priority: Low   Medications- reviewed and updated Current Outpatient Prescriptions  Medication Sig Dispense Refill  . fluticasone (FLONASE) 50 MCG/ACT nasal spray Place 2 sprays into both nostrils daily. (Patient not taking: Reported on 09/27/2014) 16 g 6  . pseudoephedrine (SUDAFED) 30 MG tablet Take 30 mg by mouth every 4 (four) hours as needed for congestion.      Objective: BP 120/90 mmHg  Pulse 60  Temp(Src) 97.6 F (36.4 C)  Wt 190 lb (86.183 kg) Gen: NAD, resting comfortably, well appearing HEENT: nares normal, oropharynx normal without pharyngeal exudate, TM normal bilaterally, Mucous membranes are moist. CV: RRR no murmurs rubs or gallops Lungs: CTAB no crackles, wheeze, rhonchi Abdomen: soft/nontender/nondistended/normal  bowel sounds. Ext: no edema Skin: warm, dry, no rash  Neuro: grossly normal, moves all extremities  Assessment/Plan:  Cough/Post viral cough Suspect postviral cough after viral upper respiratory infection. There is no evidence of active bacterial infection. We will trial a course of prednisone. Patient will call in if this does not relieve symptoms and I will provide a prescription for Hycodan. If no relief within  20 days from today we will see patient back in the office.  Of note patient has no history of asthma. He is not on an ACE inhibitor. He has no history of reflux. He was on Flonase and the cough did not resolve- allergic rhinitis less likely. also he is not seizing or having watery itchy eyes. Return precautions advised.   Meds ordered this encounter  Medications  . predniSONE (DELTASONE) 50 MG tablet    Sig: Take 1 tab for 5 days, then 1/2 tab for 2 days    Dispense:  6 tablet    Refill:  0

## 2014-09-27 NOTE — Patient Instructions (Signed)
Post viral cough  Prednisone Take 1 tab for 5 days, then 1/2 tab for 2 days  If no improvement at 10 days, I can write for a strong cough syrup  If persists after 10 days of prednisone and 7 days of cough syrup, see me back

## 2015-07-03 ENCOUNTER — Encounter: Payer: Self-pay | Admitting: Internal Medicine

## 2015-09-11 ENCOUNTER — Other Ambulatory Visit (INDEPENDENT_AMBULATORY_CARE_PROVIDER_SITE_OTHER): Payer: 59

## 2015-09-11 DIAGNOSIS — Z0000 Encounter for general adult medical examination without abnormal findings: Secondary | ICD-10-CM

## 2015-09-11 DIAGNOSIS — R319 Hematuria, unspecified: Secondary | ICD-10-CM

## 2015-09-11 LAB — CBC WITH DIFFERENTIAL/PLATELET
BASOS ABS: 0 10*3/uL (ref 0.0–0.1)
Basophils Relative: 0.8 % (ref 0.0–3.0)
EOS ABS: 0.2 10*3/uL (ref 0.0–0.7)
Eosinophils Relative: 5 % (ref 0.0–5.0)
HEMATOCRIT: 44.3 % (ref 39.0–52.0)
Hemoglobin: 14.9 g/dL (ref 13.0–17.0)
LYMPHS PCT: 36.5 % (ref 12.0–46.0)
Lymphs Abs: 1.4 10*3/uL (ref 0.7–4.0)
MCHC: 33.6 g/dL (ref 30.0–36.0)
MCV: 93.4 fl (ref 78.0–100.0)
Monocytes Absolute: 0.4 10*3/uL (ref 0.1–1.0)
Monocytes Relative: 10.5 % (ref 3.0–12.0)
NEUTROS ABS: 1.8 10*3/uL (ref 1.4–7.7)
NEUTROS PCT: 47.2 % (ref 43.0–77.0)
PLATELETS: 215 10*3/uL (ref 150.0–400.0)
RBC: 4.74 Mil/uL (ref 4.22–5.81)
RDW: 14.2 % (ref 11.5–15.5)
WBC: 3.7 10*3/uL — ABNORMAL LOW (ref 4.0–10.5)

## 2015-09-11 LAB — URINALYSIS, MICROSCOPIC ONLY

## 2015-09-11 LAB — BASIC METABOLIC PANEL
BUN: 17 mg/dL (ref 6–23)
CHLORIDE: 106 meq/L (ref 96–112)
CO2: 29 mEq/L (ref 19–32)
Calcium: 9.3 mg/dL (ref 8.4–10.5)
Creatinine, Ser: 1.17 mg/dL (ref 0.40–1.50)
GFR: 82.75 mL/min (ref >60.00)
Glucose, Bld: 96 mg/dL (ref 70–99)
POTASSIUM: 4.5 meq/L (ref 3.5–5.1)
SODIUM: 142 meq/L (ref 135–145)

## 2015-09-11 LAB — LIPID PANEL
CHOL/HDL RATIO: 4
Cholesterol: 194 mg/dL (ref 0–200)
HDL: 50 mg/dL (ref >39.00)
LDL CALC: 135 mg/dL — AB (ref 0–99)
NonHDL: 144.26
Triglycerides: 45 mg/dL (ref 0.0–149.0)
VLDL: 9 mg/dL (ref 0.0–40.0)

## 2015-09-11 LAB — POC URINALSYSI DIPSTICK (AUTOMATED)
BILIRUBIN UA: NEGATIVE
Glucose, UA: NEGATIVE
KETONES UA: NEGATIVE
Leukocytes, UA: NEGATIVE
NITRITE UA: NEGATIVE
PH UA: 5.5
PROTEIN UA: NEGATIVE
Spec Grav, UA: >=1.03
Urobilinogen, UA: 0.2

## 2015-09-11 LAB — HEPATIC FUNCTION PANEL
ALK PHOS: 67 U/L (ref 39–117)
ALT: 18 U/L (ref 0–53)
AST: 24 U/L (ref 0–37)
Albumin: 4.2 g/dL (ref 3.5–5.2)
BILIRUBIN DIRECT: 0.2 mg/dL (ref 0.0–0.3)
BILIRUBIN TOTAL: 0.9 mg/dL (ref 0.2–1.2)
Total Protein: 7 g/dL (ref 6.0–8.3)

## 2015-09-12 LAB — PSA: PSA: 0.54 ng/mL (ref 0.10–4.00)

## 2015-09-12 LAB — TSH: TSH: 2.42 u[IU]/mL (ref 0.35–4.50)

## 2015-09-18 ENCOUNTER — Encounter: Payer: Self-pay | Admitting: Family Medicine

## 2015-09-18 ENCOUNTER — Encounter: Payer: Self-pay | Admitting: Internal Medicine

## 2015-09-18 ENCOUNTER — Ambulatory Visit (INDEPENDENT_AMBULATORY_CARE_PROVIDER_SITE_OTHER): Payer: 59 | Admitting: Family Medicine

## 2015-09-18 VITALS — BP 140/92 | HR 60 | Temp 97.9°F | Ht 71.0 in | Wt 198.0 lb

## 2015-09-18 DIAGNOSIS — Z86010 Personal history of colonic polyps: Secondary | ICD-10-CM

## 2015-09-18 DIAGNOSIS — Z0001 Encounter for general adult medical examination with abnormal findings: Secondary | ICD-10-CM

## 2015-09-18 DIAGNOSIS — Z860101 Personal history of adenomatous and serrated colon polyps: Secondary | ICD-10-CM

## 2015-09-18 DIAGNOSIS — E785 Hyperlipidemia, unspecified: Secondary | ICD-10-CM | POA: Diagnosis not present

## 2015-09-18 DIAGNOSIS — R6889 Other general symptoms and signs: Secondary | ICD-10-CM

## 2015-09-18 DIAGNOSIS — I10 Essential (primary) hypertension: Secondary | ICD-10-CM | POA: Diagnosis not present

## 2015-09-18 NOTE — Patient Instructions (Addendum)
[facility] will call you regarding colonoscopy.  You officially have high blood pressure at this point. We are going to seek to control this without meds. You are going to try to cut down on eating out. Continue exercise. Goal 10 lbs down within 3 months then see me at that time. DASH eating plan below- lots of good information on this online as well.   DASH Eating Plan DASH stands for "Dietary Approaches to Stop Hypertension." The DASH eating plan is a healthy eating plan that has been shown to reduce high blood pressure (hypertension). Additional health benefits may include reducing the risk of type 2 diabetes mellitus, heart disease, and stroke. The DASH eating plan may also help with weight loss. WHAT DO I NEED TO KNOW ABOUT THE DASH EATING PLAN? For the DASH eating plan, you will follow these general guidelines:  Choose foods with a percent daily value for sodium of less than 5% (as listed on the food label).  Use salt-free seasonings or herbs instead of table salt or sea salt.  Check with your health care provider or pharmacist before using salt substitutes.  Eat lower-sodium products, often labeled as "lower sodium" or "no salt added."  Eat fresh foods.  Eat more vegetables, fruits, and low-fat dairy products.  Choose whole grains. Look for the word "whole" as the first word in the ingredient list.  Choose fish and skinless chicken or Malaw[place]urkey more often than red meat. Limit fish, poultry, and meat to 6 oz (170 g) each day.  Limit sweets, desserts, sugars, and sugary drinks.  Choose heart-healthy fats.  Limit cheese to 1 oz (28 g) per day.  Eat more home-cooked food and less restaurant, buffet, and fast food.  Limit fried foods.  Cook foods using methods other than frying.  Limit canned vegetables. If you do use them, rinse them well to decrease the sodium.  When eating at a restaurant, ask that your food be prepared with less salt, or no salt if possible. WHAT FOODS CAN  I EAT? Seek help from a dietitian for individual calorie needs. Grains Whole grain or whole wheat bread. Brown rice. Whole grain or whole wheat pasta. Quinoa, bulgur, and whole grain cereals. Low-sodium cereals. Corn or whole wheat flour tortillas. Whole grain cornbread. Whole grain crackers. Low-sodium crackers. Vegetables Fresh or frozen vegetables (raw, steamed, roasted, or grilled). Low-sodium or reduced-sodium tomato and vegetable juices. Low-sodium or reduced-sodium tomato sauce and paste. Low-sodium or reduced-sodium canned vegetables.  Fruits All fresh, canned (in natural juice), or frozen fruits. Meat and Other Protein Products Ground beef (85% or leaner), grass-fed beef, or beef trimmed of fat. Skinless chicken or [place]key. Ground chicken or [place]urkey. Pork trimmed of fat. All fish and seafood. Eggs. Dried beans, peas, or lentils. Unsalted nuts and seeds. Unsalted canned beans. Dairy Low-fat dairy products, such as skim or 1% milk, 2% or reduced-fat cheeses, low-fat ricotta or cottage cheese, or plain low-fat yogurt. Low-sodium or reduced-sodium cheeses. Fats and Oils Tub margarines without trans fats. Light or reduced-fat mayonnaise and salad dressings (reduced sodium). Avocado. Safflower, olive, or canola oils. Natural peanut or almond butter. Other Unsalted popcorn and pretzels. The items listed above may not be a complete list of recommended foods or beverages. Contact your dietitian for more options. WHAT FOODS ARE NOT RECOMMENDED? Grains White bread. White pasta. White rice. Refined cornbread. Bagels and croissants. Crackers that contain trans fat. Vegetables Creamed or fried vegetables. Vegetables in a cheese sauce. Regular canned vegetables. Regular canned tomato  sauce and paste. Regular tomato and vegetable juices. Fruits Dried fruits. Canned fruit in light or heavy syrup. Fruit juice. Meat and Other Protein Products Fatty cuts of meat. Ribs, chicken wings, bacon, sausage,  bologna, salami, chitterlings, fatback, hot dogs, bratwurst, and packaged luncheon meats. Salted nuts and seeds. Canned beans with salt. Dairy Whole or 2% milk, cream, half-and-half, and cream cheese. Whole-fat or sweetened yogurt. Full-fat cheeses or blue cheese. Nondairy creamers and whipped toppings. Processed cheese, cheese spreads, or cheese curds. Condiments Onion and garlic salt, seasoned salt, table salt, and sea salt. Canned and packaged gravies. Worcestershire sauce. Tartar sauce. Barbecue sauce. Teriyaki sauce. Soy sauce, including reduced sodium. Steak sauce. Fish sauce. Oyster sauce. Cocktail sauce. Horseradish. Ketchup and mustard. Meat flavorings and tenderizers. Bouillon cubes. Hot sauce. Tabasco sauce. Marinades. Taco seasonings. Relishes. Fats and Oils Butter, stick margarine, lard, shortening, ghee, and bacon fat. Coconut, palm kernel, or palm oils. Regular salad dressings. Other Pickles and olives. Salted popcorn and pretzels. The items listed above may not be a complete list of foods and beverages to avoid. Contact your dietitian for more information. WHERE CAN I FIND MORE INFORMATION? National Heart, Lung, and Blood Institute: travelstabloid.com   This information is not intended to replace advice given to you by your health care provider. Make sure you discuss any questions you have with your health care provider.   Document Released: 05/27/2011 Document Revised: 06/28/2014 Document Reviewed: 04/11/2013 Elsevier Interactive Patient Education [employer].

## 2015-09-18 NOTE — Assessment & Plan Note (Signed)
09/18/15 8.1% 10 year risk. Patient declines statin. i will push harder over 10-12 %

## 2015-09-18 NOTE — Assessment & Plan Note (Signed)
S: poorly controlled no meds. Weight up over 10 lbs. Eating out a lot  BP Readings from Last 3 Encounters:  09/18/15 140/92  09/27/14 120/90  08/23/14 140/80  A/P: declines meds. Agrees to DASH Diet, continued exercise, goal 10 lbs down over 3 months. Follow up at that time.

## 2015-09-18 NOTE — Assessment & Plan Note (Signed)
12/06/2009 with tubular adenoma x1. Refer to GI for 5 year repeat

## 2015-09-18 NOTE — Progress Notes (Signed)
Tana Conch, MD Phone: [phone]  Subjective:  Patient presents today for their annual physical. Chief complaint-noted.   See problem oriented charting- ROS- full  review of systems was completed and negative including No chest pain or shortness of breath. No headache or blurry vision. Sometimes feels "off" after eating out but cannot describe very well.   The following were reviewed and entered/updated in epic: Past Medical History  Diagnosis Date  . Allergy   . Arthritis     patient denies  . TEAR, MEDIAL MENISCUS, KNEE, CURRENT 03/30/2007    patient denies   Patient Active Problem List   Diagnosis Date Noted  . Essential hypertension 09/18/2015    Priority: Medium  . Hyperlipidemia 03/28/2014    Priority: Medium  . ALLERGIC RHINITIS 01/17/2007    Priority: Low  . History of adenomatous polyp of colon 09/18/2015   Past Surgical History  Procedure Laterality Date  . Cyst removal trunk      around 2005    Family History  Problem Relation Age of Onset  . Hypertension Mother   . Hypertension Brother     Medications- reviewed and updated, none  Allergies-reviewed and updated No Known Allergies  Social History   Social History  . Marital Status: Married    Spouse Name: N/A  . Number of Children: N/A  . Years of Education: N/A   Social History Main Topics  . Smoking status: Never Smoker   . Smokeless tobacco: None  . Alcohol Use: No  . Drug Use: No  . Sexual Activity: Yes   Other Topics Concern  . None   Social History Narrative   Married 1987. Wife Dianna. 2 kids-Tanisha lives in [place] no kids. tashira lives in [place] with 34 kid-20 years old.       Works as Administrator.       Hobbies: tennis, fishing      ROS--See HPI   Objective: BP 140/92 mmHg  Pulse 60  Temp(Src) 97.9 F (36.6 C)  Ht  (1.803 m)  Wt 198 lb (89.812 kg)  BMI 27.63 kg/m2 Gen: NAD, resting comfortably HEENT: Mucous membranes are moist.  Oropharynx normal. Rim of cerumen on right ear Ardine Bjork Hines to irrigate) Neck: no thyromegaly CV: RRR no murmurs rubs or gallops Lungs: CTAB no crackles, wheeze, rhonchi Abdomen: soft/nontender/nondistended/normal bowel sounds. No rebound or guarding.  Rectal: normal tone, diffusely enlarged prostate, no masses or tenderness Ext: no edema Skin: warm, dry Neuro: grossly normal, moves all extremities, PERRLA  Assessment/Plan:  55 y.o. male presenting for annual physical.  Health Maintenance counseling: 1. Anticipatory guidance: Patient counseled regarding regular dental exams, eye exams-is having some issues with very small fine print- trying reading glasses, wearing seatbelts.  2. Risk factor reduction:  Advised patient of need for regular exercise and diet rich and fruits and vegetables to reduce risk of heart attack and stroke. Previously discussed he would prefer to be closer to 175-180. Has started to improve diet and increased veggies. Still struggling with sweets/ ice cream Wt Readings from Last 3 Encounters:  09/18/15 198 lb (89.812 kg)  09/27/14 190 lb (86.183 kg)  08/23/14 193 lb (87.544 kg)  3. Immunizations/screenings/ancillary studies Immunization History  Administered Date(s) Administered  . Td- next visit 06/21/2005, 12/23/2005   Health Maintenance Due  Topic Date Due  . Hepatitis C Screening - decline 04-08-1961  . HIV Screening - decline 01/26/1974   4. Prostate cancer screening- low risk based off PSA and  rectal exam   Lab Results  Component Value Date   PSA 0.54 09/11/2015   PSA 0.57 03/28/2014   PSA 0.68 04/23/2013   5. Colon cancer screening - 12/06/2009 with tubular adenoma x1. Needs repeat at this point. - refer  Hyperlipidemia 09/18/15 8.1% 10 year risk. Patient declines statin. i will push harder over 10-12 %  Essential hypertension S: poorly controlled no meds. Weight up over 10 lbs. Eating out a lot  BP Readings from Last 3 Encounters:  09/18/15 140/92   09/27/14 120/90  08/23/14 140/80  A/P: declines meds. Agrees to DASH Diet, continued exercise, goal 10 lbs down over 3 months. Follow up at that time.     History of adenomatous polyp of colon  12/06/2009 with tubular adenoma x1. Refer to GI for 5 year repeat   Return in about 3 months (around 12/19/2015). Return precautions advised.   Orders Placed This Encounter  Procedures  . Ambulatory referral to Gastroenterology    Referral Priority:  Routine    Referral Type:  Consultation    Referral Reason:  Specialty Services Required    Number of Visits Requested:  1

## 2015-09-22 ENCOUNTER — Telehealth: Payer: Self-pay | Admitting: Family Medicine

## 2015-09-22 MED ORDER — BENZONATATE 100 MG PO CAPS: 100.0000 mg | ORAL_CAPSULE | Freq: Two times a day (BID) | ORAL | Status: DC | PRN

## 2015-09-22 NOTE — Telephone Encounter (Signed)
Pt states he is still having a hacking cough and he would like you to send him something in for cough. He is feeling much better and not coughing up any phlegm, no fever no drainage just a nagging cough.

## 2015-09-22 NOTE — Telephone Encounter (Signed)
Pt would like you (dr Therapist, nutritionalhunter) to call him and refused to elaborate.

## 2015-09-22 NOTE — Telephone Encounter (Signed)
Sent in tessalon perles- if he is still having issues when he runs out of medication- return for repeat evaluation

## 2015-09-22 NOTE — Telephone Encounter (Signed)
Pt.notified

## 2015-11-06 ENCOUNTER — Telehealth: Payer: Self-pay | Admitting: *Deleted

## 2015-11-06 NOTE — Telephone Encounter (Signed)
Pt no showed Pv today at 1030 am.  Called pt and it states the voice mail for Mellody Dancekeith Granlund is full and you cannot leave a message. Please try your call at a later time. Mailed letter Cancelled [facility]

## 2015-11-20 ENCOUNTER — Encounter: Payer: 59 | Admitting: Internal Medicine

## 2015-12-19 ENCOUNTER — Ambulatory Visit: Payer: Self-pay | Admitting: Family Medicine

## 2015-12-19 DIAGNOSIS — Z0289 Encounter for other administrative examinations: Secondary | ICD-10-CM

## 2016-01-07 ENCOUNTER — Encounter: Payer: 59 | Admitting: Internal Medicine

## 2016-08-05 ENCOUNTER — Encounter: Payer: Self-pay | Admitting: Family Medicine

## 2016-08-05 ENCOUNTER — Ambulatory Visit (INDEPENDENT_AMBULATORY_CARE_PROVIDER_SITE_OTHER): Payer: 59 | Admitting: Family Medicine

## 2016-08-05 VITALS — BP 130/82 | HR 71 | Temp 98.4°F | Ht 71.0 in | Wt 192.2 lb

## 2016-08-05 DIAGNOSIS — J069 Acute upper respiratory infection, unspecified: Secondary | ICD-10-CM

## 2016-08-05 NOTE — Progress Notes (Signed)
PCP: Tana ConchStephen Elan Brainerd, MD  Subjective:  Joseph Boyle is a 56 y.o. year old very pleasant male patient who presents with Upper Respiratory infection symptoms including nasal congestion with runny nose, sore throat (now largely resolved, cough productive at times of yellow sputum -started: sunday, symptoms are improving slightly -previous treatments:  Delsym and tessalon for cough not helpful -sick contacts/travel/risks: denies flu exposure.  - has good response in past to a cough medicine he cannot remember name of but wants to check on at home before being prescribed  ROS-denies fever, SOB, NVD, tooth pain  Pertinent Past Medical History-  Patient Active Problem List   Diagnosis Date Noted  . Essential hypertension 09/18/2015    Priority: Medium  . Hyperlipidemia 03/28/2014    Priority: Medium  . ALLERGIC RHINITIS 01/17/2007    Priority: Low  . History of adenomatous polyp of colon 09/18/2015   Medications- reviewed , no prescribed rx  Objective: BP 130/82 (BP Location: Left Arm, Patient Position: Sitting, Cuff Size: Large)   Pulse 71   Temp 98.4 F (36.9 C) (Oral)   Ht 5\' 11"  (1.803 m)   Wt 192 lb 3.2 oz (87.2 kg)   SpO2 97%   BMI 26.81 kg/m  Gen: NAD, resting comfortably, does not appear too run down HEENT: Turbinates erythematous, TM normal, pharynx mildly erythematous with no tonsilar exudate or edema, no sinus tenderness CV: RRR no murmurs rubs or gallops Lungs: CTAB no crackles, wheeze, rhonchi Ext: no edema Skin: warm, dry, no rash  Assessment/Plan:  Upper Respiratory infection History and exam today are suggestive of viral infection most likely due to upper respiratory infection. Symptomatic treatment with: cough syrup (patient to go home and check on prescription that has worked well for him- we talked about a codeine cough syrup vs. Hycodan as tessalon did not work for him and delsym didn't work for him). Will send us mychart message about name- can come back by  to pick up  We discussed that we did not find any infection that had higher probability of being bacterial such as pneumonia or strep throat. We discussed signs that bacterial infection may have developed particularly fever or shortness of breath. Likely course of 1-2 weeks. Patient is contagious and advised good handwashing and consideration of mask If going to be in public places.   Finally, we reviewed reasons to return to care including if symptoms worsen or persist or new concerns arise- once again particularly shortness of breath or fever.  No orders of the defined types were placed in this encounter.  Tana ConchStephen Westlee Devita, MD

## 2016-08-05 NOTE — Progress Notes (Signed)
Pre visit review using our clinic review tool, if applicable. No additional management support is needed unless otherwise documented below in the visit note. 

## 2016-08-05 NOTE — Patient Instructions (Signed)
History and exam today are suggestive of viral infection most likely due to upper respiratory infection. Symptomatic treatment with: cough syrup (patient to go home and check on prescription that has worked well for him- we talked about a codeine cough syrup vs. Hycodan as tessalon did not work for him and delsym didn't work for him)  We discussed that we did not find any infection that had higher probability of being bacterial such as pneumonia or strep throat. We discussed signs that bacterial infection may have developed particularly fever or shortness of breath. Likely course of 1-2 weeks. Patient is contagious and advised good handwashing and consideration of mask If going to be in public places.   Finally, we reviewed reasons to return to care including if symptoms worsen or persist or new concerns arise- once again particularly shortness of breath or fever.

## 2016-09-29 ENCOUNTER — Encounter: Payer: Self-pay | Admitting: Family Medicine

## 2017-12-18 ENCOUNTER — Emergency Department (HOSPITAL_COMMUNITY): Payer: PRIVATE HEALTH INSURANCE

## 2017-12-18 ENCOUNTER — Encounter (HOSPITAL_COMMUNITY): Payer: Self-pay | Admitting: Emergency Medicine

## 2017-12-18 ENCOUNTER — Other Ambulatory Visit: Payer: Self-pay

## 2017-12-18 ENCOUNTER — Emergency Department (HOSPITAL_COMMUNITY)
Admission: EM | Admit: 2017-12-18 | Discharge: 2017-12-18 | Disposition: A | Discharge status: Home / Self Care | Payer: PRIVATE HEALTH INSURANCE | Attending: Emergency Medicine | Admitting: Emergency Medicine

## 2017-12-18 DIAGNOSIS — K5909 Other constipation: Secondary | ICD-10-CM

## 2017-12-18 DIAGNOSIS — Z5321 Procedure and treatment not carried out due to patient leaving prior to being seen by health care provider: Secondary | ICD-10-CM | POA: Insufficient documentation

## 2017-12-18 DIAGNOSIS — R6883 Chills (without fever): Secondary | ICD-10-CM | POA: Insufficient documentation

## 2017-12-18 DIAGNOSIS — R1031 Right lower quadrant pain: Secondary | ICD-10-CM | POA: Diagnosis present

## 2017-12-18 DIAGNOSIS — R110 Nausea: Secondary | ICD-10-CM | POA: Insufficient documentation

## 2017-12-18 DIAGNOSIS — R112 Nausea with vomiting, unspecified: Secondary | ICD-10-CM | POA: Insufficient documentation

## 2017-12-18 DIAGNOSIS — K5900 Constipation, unspecified: Secondary | ICD-10-CM | POA: Insufficient documentation

## 2017-12-18 DIAGNOSIS — N200 Calculus of kidney: Secondary | ICD-10-CM | POA: Diagnosis not present

## 2017-12-18 DIAGNOSIS — I10 Essential (primary) hypertension: Secondary | ICD-10-CM | POA: Insufficient documentation

## 2017-12-18 DIAGNOSIS — R109 Unspecified abdominal pain: Secondary | ICD-10-CM | POA: Insufficient documentation

## 2017-12-18 LAB — CBC
HCT: 43.9 % (ref 39.0–52.0)
Hemoglobin: 14.5 g/dL (ref 13.0–17.0)
MCH: 30.9 pg (ref 26.0–34.0)
MCHC: 33 g/dL (ref 30.0–36.0)
MCV: 93.6 fL (ref 78.0–100.0)
PLATELETS: 227 10*3/uL (ref 150–400)
RBC: 4.69 MIL/uL (ref 4.22–5.81)
RDW: 13.2 % (ref 11.5–15.5)
WBC: 8.9 10*3/uL (ref 4.0–10.5)

## 2017-12-18 LAB — COMPREHENSIVE METABOLIC PANEL
ALK PHOS: 61 U/L (ref 38–126)
ALT: 19 U/L (ref 0–44)
AST: 28 U/L (ref 15–41)
Albumin: 4.2 g/dL (ref 3.5–5.0)
Anion gap: 9 (ref 5–15)
BUN: 16 mg/dL (ref 6–20)
CHLORIDE: 104 mmol/L (ref 98–111)
CO2: 27 mmol/L (ref 22–32)
CREATININE: 1.57 mg/dL — AB (ref 0.61–1.24)
Calcium: 9.3 mg/dL (ref 8.9–10.3)
GFR calc non Af Amer: 47 mL/min — ABNORMAL LOW (ref >60)
GFR, EST AFRICAN AMERICAN: 54 mL/min — AB (ref >60)
GLUCOSE: 153 mg/dL — AB (ref 70–99)
Potassium: 4.1 mmol/L (ref 3.5–5.1)
Sodium: 140 mmol/L (ref 135–145)
Total Bilirubin: 1.5 mg/dL — ABNORMAL HIGH (ref 0.3–1.2)
Total Protein: 7.3 g/dL (ref 6.5–8.1)

## 2017-12-18 LAB — LIPASE, BLOOD: Lipase: 24 U/L (ref 11–51)

## 2017-12-18 LAB — URINALYSIS, ROUTINE W REFLEX MICROSCOPIC
Bacteria, UA: NONE SEEN
Bilirubin Urine: NEGATIVE
Glucose, UA: 50 mg/dL — AB
KETONES UR: 5 mg/dL — AB
Leukocytes, UA: NEGATIVE
Nitrite: NEGATIVE
PH: 7 (ref 5.0–8.0)
PROTEIN: NEGATIVE mg/dL
Specific Gravity, Urine: 1.016 (ref 1.005–1.030)

## 2017-12-18 MED ORDER — ONDANSETRON 4 MG PO TBDP: 4.0000 mg | ORAL_TABLET | Freq: Three times a day (TID) | ORAL | 0 refills | Status: DC | PRN

## 2017-12-18 MED ORDER — SODIUM CHLORIDE 0.9 % IV BOLUS
1000.0000 mL | Freq: Once | INTRAVENOUS | Status: AC
  Administered 2017-12-18: 1000 mL via INTRAVENOUS

## 2017-12-18 MED ORDER — ONDANSETRON HCL 4 MG/2ML IJ SOLN
4.0000 mg | Freq: Once | INTRAMUSCULAR | Status: AC
  Administered 2017-12-18: 4 mg via INTRAVENOUS
  Filled 2017-12-18: qty 2

## 2017-12-18 MED ORDER — IOHEXOL 300 MG/ML  SOLN
100.0000 mL | Freq: Once | INTRAMUSCULAR | Status: AC | PRN
  Administered 2017-12-18: 100 mL via INTRAVENOUS

## 2017-12-18 MED ORDER — OXYCODONE-ACETAMINOPHEN 5-325 MG PO TABS: 1.0000 | ORAL_TABLET | ORAL | 0 refills | Status: DC | PRN

## 2017-12-18 MED ORDER — TAMSULOSIN HCL 0.4 MG PO CAPS: 0.4000 mg | ORAL_CAPSULE | Freq: Every day | ORAL | 0 refills | Status: DC

## 2017-12-18 MED ORDER — MORPHINE SULFATE (PF) 4 MG/ML IV SOLN
4.0000 mg | Freq: Once | INTRAVENOUS | Status: AC
  Administered 2017-12-18: 4 mg via INTRAVENOUS
  Filled 2017-12-18: qty 1

## 2017-12-18 MED ORDER — PEG 3350-KCL-NABCB-NACL-NASULF 236 G PO SOLR: 4000.0000 mL | Freq: Once | ORAL | 0 refills | Status: AC

## 2017-12-18 NOTE — ED Triage Notes (Signed)
Patient here from with complaints of right side flank pain radiating into right side of abdominal. Nausea. Constipation x7 days.

## 2017-12-18 NOTE — ED Triage Notes (Addendum)
Pt states he has had RLQ abdominal pain starting today. Pain currently 8/10. No abdominal tenderness upon palpation. Pt reports emesis X4. Last BM over 5 days ago, pt states he struggles with constipation weekly and for his whole life has always only had a BM once a week.

## 2017-12-18 NOTE — Discharge Instructions (Addendum)
°  Kidney Stone There is evidence of a kidney stone on the right side.  Some kidney stones can take up to 30 days to pass. Hydration: Hydration is key to helping a kidney stone pass.  Have a goal of half a liter of water every hour or two. Antiinflammatory medications: Take 400-600 mg of ibuprofen every 6 hours or 440 mg of naproxen every 12 hours for the next 3 days. After this time, these medications may be used as needed for pain. Take these medications with food to avoid upset stomach. Choose only one of these medications, do not take them together. Tylenol: Should you continue to have additional pain while taking the ibuprofen or naproxen, you may add in tylenol as needed. Your daily total maximum amount of tylenol from all sources should be limited to 4000mg /day for persons without liver problems, or 2000mg /day for those with liver problems. Percocet: May take Percocet as needed for severe pain.  Do not drive or perform other dangerous activities while taking the Percocet.  Please note that each pill of Percocet contains 325 mg of Tylenol and the above dosage limits apply. Tamsulosin: This medication is designed to help the stone pass.  Take this medication daily until stone passes. Zofran: Use of Zofran, as needed, for nausea/vomiting. Follow-up: Follow-up with the urologist as soon as possible on this matter. There were also cysts noted on both kidneys. These may be addressed with the urologist as well and may need additional imaging.    Constipation: You have evidence of constipation. Please follow the instructions below:  Hydration: You should start drinking at least ten, 8 oz glasses of water a day to help relieve your constipation. Baseline hydration should be at least eight, 8 oz glasses of water a day. This is a daily amount that you should be drinking, even without your current issue. Proper hydration not only helps prevent constipation, it is essential for any of the following  treatments to be effective.  Fiber: Begin taking the fiber supplement daily. You should also increase the fiber in your diet.  MiraLAX: You may begin taking MiraLAX daily until you are having at least 1 soft bowel movement a day.  GoLYTELY: Alternatively, you can try the GoLYTELY.  Follow-up: Follow-up with your primary care provider as soon as possible for continued management of this issue.  Return: Return to the ED should any symptoms worsen.

## 2017-12-18 NOTE — ED Provider Notes (Signed)
[facility]   CSN: [id number] Arrival date & time: 12/18/17  1529     History   Chief Complaint Chief Complaint  Patient presents with  . Abdominal Pain  . Emesis    HPI Joseph Boyle is a 57 y.o. male.  HPI   Joseph AlaKeith B Asmus is a 57 y.o. male, patient with no pertinent past medical history, presenting to the ED with abdominal pain beginning around 10 AM this morning.  Pain is right lower quadrant, constant, 9/10, described as a soreness, nonradiating.  Accompanied by nausea, dry heaving, and chills.  Has not had a bowel movement in 5 days.  States it is typical for him to have only one bowel movement a week. Denies urinary symptoms, fever, diarrhea, hematochezia/melena, abdominal distention, or any other complaints.     Past Medical History:  Diagnosis Date  . Arthritis    patient denies  . TEAR, MEDIAL MENISCUS, KNEE, CURRENT 03/30/2007   patient denies    Patient Active Problem List   Diagnosis Date Noted  . History of adenomatous polyp of colon 09/18/2015  . Essential hypertension 09/18/2015  . Hyperlipidemia 03/28/2014  . ALLERGIC RHINITIS 01/17/2007    Past Surgical History:  Procedure Laterality Date  . CYST REMOVAL TRUNK     around 2005        Home Medications    Prior to Admission medications   Medication Sig Start Date End Date Taking? Authorizing Provider  ondansetron (ZOFRAN ODT) 4 MG disintegrating tablet Take 1 tablet (4 mg total) by mouth every 8 (eight) hours as needed for nausea or vomiting. 12/18/17   Fallon Haecker C, PA-C  oxyCODONE-acetaminophen (PERCOCET/ROXICET) 5-325 MG tablet Take 1-2 tablets by mouth every 4 (four) hours as needed for severe pain. 12/18/17   Basheer Molchan C, PA-C  tamsulosin (FLOMAX) 0.4 MG CAPS capsule Take 1 capsule (0.4 mg total) by mouth daily. 12/18/17   Raine Elsass, Hillard DankerShawn C, PA-C    Family History Family History  Problem Relation Age of Onset  . Hypertension Mother   .  Hypertension Brother     Social History Social History   Tobacco Use  . Smoking status: Never Smoker  . Smokeless tobacco: Never Used  Substance Use Topics  . Alcohol use: No  . Drug use: No     Allergies   Patient has no known allergies.   Review of Systems Review of Systems  Constitutional: Positive for chills. Negative for diaphoresis and fever.  Respiratory: Negative for shortness of breath.   Cardiovascular: Negative for chest pain.  Gastrointestinal: Positive for abdominal pain, constipation and nausea. Negative for blood in stool.  Genitourinary: Negative for difficulty urinating, dysuria, frequency and hematuria.  Musculoskeletal: Negative for back pain.  All other systems reviewed and are negative.    Physical Exam Updated Vital Signs BP (!) 166/84 (BP Location: Right Arm)   Pulse (!) 59   Temp 97.8 F (36.6 C) (Oral)   Resp 18   SpO2 100%   Physical Exam  Constitutional: He appears well-developed and well-nourished. No distress.  HENT:  Head: Normocephalic and atraumatic.  Eyes: Conjunctivae are normal.  Neck: Neck supple.  Cardiovascular: Normal rate, regular rhythm, normal heart sounds and intact distal pulses.  Pulmonary/Chest: Effort normal and breath sounds normal. No respiratory distress.  Abdominal: Soft. Bowel sounds are normal. There is no tenderness. There is no guarding and no CVA tenderness.  No abdominal tenderness on exam.  Musculoskeletal: He exhibits  no edema.  Lymphadenopathy:    He has no cervical adenopathy.  Neurological: He is alert.  Skin: Skin is warm and dry. He is not diaphoretic.  Psychiatric: He has a normal mood and affect. His behavior is normal.  Nursing note and vitals reviewed.    ED Treatments / Results  Labs (all labs ordered are listed, but only abnormal results are displayed) Labs Reviewed  COMPREHENSIVE METABOLIC PANEL - Abnormal; Notable for the following components:      Result Value   Glucose, Bld 153  (*)    Creatinine, Ser 1.57 (*)    Total Bilirubin 1.5 (*)    GFR calc non Af Amer 47 (*)    GFR calc Af Amer 54 (*)    All other components within normal limits  URINALYSIS, ROUTINE W REFLEX MICROSCOPIC - Abnormal; Notable for the following components:   APPearance HAZY (*)    Glucose, UA 50 (*)    Hgb urine dipstick LARGE (*)    Ketones, ur 5 (*)    RBC / HPF >50 (*)    All other components within normal limits  LIPASE, BLOOD  CBC   BUN  Date Value Ref Range Status  12/18/2017 16 6 - 20 mg/dL Final    Comment:    Please note change in reference range.  09/11/2015 17 6 - 23 mg/dL Final  40/98/1191 17 6 - 23 mg/dL Final  47/82/9562 14 6 - 23 mg/dL Final   Creatinine, Ser  Date Value Ref Range Status  12/18/2017 1.57 (H) 0.61 - 1.24 mg/dL Final  13/01/6577 4.69 0.40 - 1.50 mg/dL Final  62/95/2841 1.2 0.4 - 1.5 mg/dL Final  32/44/0102 1.1 0.4 - 1.5 mg/dL Final    EKG None  Radiology Ct Abdomen Pelvis W Contrast  Result Date: 12/18/2017 CLINICAL DATA:  Right lower quadrant pain beginning today.  Emesis. EXAM: CT ABDOMEN AND PELVIS WITH CONTRAST TECHNIQUE: Multidetector CT imaging of the abdomen and pelvis was performed using the standard protocol following bolus administration of intravenous contrast. CONTRAST:  OMNIPAQUE IOHEXOL 300 MG/ML  SOLN COMPARISON:  None. FINDINGS: Lower chest: Normal. Hepatobiliary: Normal. Pancreas: Normal. Spleen: Normal. Adrenals/Urinary Tract: Adrenal glands are normal. Kidneys are normal in size with multiple bilateral cysts. There is a 4.6 cm homogeneous hypodensity over the upper pole right kidney with Hounsfield unit measurements of 19 likely slightly hyperdense cyst. There is mild right-sided hydronephrosis and stranding of the right perinephric fat. There are 2 small nonobstructing left renal stones. No right renal stones. There is a 4 mm stone over the proximal right ureter causing low-grade obstruction. No left ureteral stones. Bladder  is normal. Stomach/Bowel: Stomach and small bowel are normal. Appendix is normal. Colon is unremarkable. Vascular/Lymphatic: Within normal. Reproductive: Normal. Other: None. Musculoskeletal: Mild degenerative change of the spine and hips. IMPRESSION: Left-sided nephrolithiasis. 4 mm stone over the proximal right ureter causing low-grade obstruction. Bilateral renal cysts. 4.6 cm hypodensity over the upper pole right kidney likely a slightly hyperdense cyst. Recommend follow-up renal ultrasound versus pre and post-contrast CT on elective basis. Electronically Signed   By: Elberta Fortis M.D.   On: 12/18/2017 20:05    Procedures Procedures (including critical care time)  Medications Ordered in ED Medications  sodium chloride 0.9 % bolus 1,000 mL (0 mLs Intravenous Stopped 12/18/17 1900)  morphine 4 MG/ML injection 4 mg (4 mg Intravenous Given 12/18/17 1702)  ondansetron (ZOFRAN) injection 4 mg (4 mg Intravenous Given 12/18/17 1702)  iohexol (OMNIPAQUE) 300  MG/ML solution 100 mL (100 mLs Intravenous Contrast Given 12/18/17 1912)     Initial Impression / Assessment and Plan / ED Course  I have reviewed the triage vital signs and the nursing notes.  Pertinent labs & imaging results that were available during my care of the patient were reviewed by me and considered in my medical decision making (see chart for details).  Clinical Course as of Dec 20 10  Sun Dec 18, 2017  1718 AKI thought to be due to dehydration.  Creatinine(!): 1.57 [SJ]  1737 Patient states he feels much better, pain and nausea have resolved.   [SJ]    Clinical Course User Index [SJ] Leanore Biggers C, PA-C    Patient presents with abdominal pain beginning today.  Microscopic hematuria on UA without evidence of infection.  AKI noted on blood work.  4 mm proximal right ureter stone on CT.  Patient's pain easily controlled in the ED.  Urology follow-up.  Additionally, patient requesting "something stronger" for his constipation.   We discussed the options and decided upon GoLYTELY. The patient was given instructions for home care as well as return precautions. Patient voices understanding of these instructions, accepts the plan, and is comfortable with discharge.  Vitals:   12/18/17 1534 12/18/17 1900 12/18/17 2102  BP: (!) 166/84  (!) 143/81  Pulse: (!) 59  (!) 53  Resp: 18  18  Temp: 97.8 F (36.6 C)    TempSrc: Oral    SpO2: 100%  96%  Weight:  81.6 kg (180 lb)   Height:  5\' 11"  (1.803 m)      Final Clinical Impressions(s) / ED Diagnoses   Final diagnoses:  Kidney stone on right side  Other constipation    ED Discharge Orders        Ordered    oxyCODONE-acetaminophen (PERCOCET/ROXICET) 5-325 MG tablet  Every 4 hours PRN     12/18/17 2040    ondansetron (ZOFRAN ODT) 4 MG disintegrating tablet  Every 8 hours PRN     12/18/17 2040    tamsulosin (FLOMAX) 0.4 MG CAPS capsule  Daily     12/18/17 2040    polyethylene glycol (GOLYTELY) 236 g solution   Once     12/18/17 2053       Concepcion Living 12/19/17 0019    Benjiman Core, MD 12/19/17 [phone]

## 2018-02-23 ENCOUNTER — Encounter: Payer: Self-pay | Admitting: Family Medicine

## 2018-02-23 ENCOUNTER — Ambulatory Visit: Payer: Self-pay | Admitting: Family Medicine

## 2018-02-23 VITALS — BP 110/78 | HR 55 | Temp 97.7°F | Ht 71.0 in | Wt 179.6 lb

## 2018-02-23 DIAGNOSIS — S91131A Puncture wound without foreign body of right great toe without damage to nail, initial encounter: Secondary | ICD-10-CM

## 2018-02-23 DIAGNOSIS — Z23 Encounter for immunization: Secondary | ICD-10-CM | POA: Diagnosis not present

## 2018-02-23 NOTE — Progress Notes (Signed)
Subjective:  Joseph Boyle is a 57 y.o. year old very pleasant male patient who presents for/with See problem oriented charting ROS- no fever, chills, nausea, vomiting, expanding redness   Past Medical History-  Patient Active Problem List   Diagnosis Date Noted  . Essential hypertension 09/18/2015    Priority: Medium  . Hyperlipidemia 03/28/2014    Priority: Medium  . ALLERGIC RHINITIS 01/17/2007    Priority: Low  . History of adenomatous polyp of colon 09/18/2015    Medications- reviewed and updated No current outpatient medications on file.   No current facility-administered medications for this visit.     Objective: BP 110/78 (BP Location: Left Arm, Patient Position: Sitting, Cuff Size: Large)   Pulse (!) 55   Temp 97.7 F (36.5 C) (Oral)   Ht 5\' 11"  (1.803 m)   Wt 179 lb 9.6 oz (81.5 kg)   SpO2 98%   BMI 25.05 kg/m  Gen: NAD, resting comfortably CV: RRR no murmurs rubs or gallops Lungs: CTAB no crackles, wheeze, rhonchi Abdomen: soft/nontender/nondistended Ext: no edema Skin: warm, dry, bottom of right foot below right MTP joint - <1 mm break in skin with minimal blood with palpation. Denies pain.   Assessment/Plan:  Puncture wound of great toe of right foot, initial encounter  Need for prophylactic vaccination with combined diphtheria-tetanus-pertussis (DTP) vaccine - Plan: Tdap vaccine greater than or equal to 7yo IM S:  Patient was helping a family member who had remodeled a garage into a salon. Unfortunately several boards with rusty nails were left outside the perimeter and patient was walking around and stepped on a board with a rusty nail. Patient was stepping down and felt a pinch and immediately was able to pull foot back up. He noted slight puncture/blood on foot in area. He thinks it was likely only in a few mm max. Minimal pain in area at time and none now. No fever, chills, expanding redness A/P: Puncture wound- offered x-ray but with depth unlikely  helpful- he declines. Doubt foreign body. Updated Tdap and advised return precautions for signs of infection. As went thorugh sole would have to consider pseudomonal coverage potentially.   Lab/Order associations: Puncture wound of great toe of right foot, initial encounter  Need for prophylactic vaccination with combined diphtheria-tetanus-pertussis (DTP) vaccine - Plan: Tdap vaccine greater than or equal to 7yo IM  Return precautions advised.  Tana Conch, MD

## 2018-02-23 NOTE — Patient Instructions (Signed)
Glad we were able to update Tdap today. Good for 10 years  Look out for worsening pain, expanding redness, fever. If any of those happen see [place] ASAP.   I like your idea of epsom salt soaks perhaps for next few days

## 2019-04-11 ENCOUNTER — Other Ambulatory Visit: Payer: Self-pay

## 2019-04-11 ENCOUNTER — Encounter: Payer: Self-pay | Admitting: Family Medicine

## 2019-04-11 ENCOUNTER — Ambulatory Visit (INDEPENDENT_AMBULATORY_CARE_PROVIDER_SITE_OTHER): Payer: No Typology Code available for payment source | Admitting: Family Medicine

## 2019-04-11 VITALS — BP 142/70 | HR 57 | Temp 98.3°F | Ht 71.0 in | Wt 177.8 lb

## 2019-04-11 DIAGNOSIS — Z4789 Encounter for other orthopedic aftercare: Secondary | ICD-10-CM

## 2019-04-11 NOTE — Progress Notes (Signed)
Patient: Joseph Boyle MRN: [id number] DOB: 01-19-1961 PCP: Marin Olp, MD     Subjective:  Chief Complaint  Patient presents with  . swelling in L knee    injury x 5 mos ago    HPI: The patient is a 58 y.o. male who presents today for left knee injury that happened about 5 months. He has no more issues and doesn't need an appointment. He just didn't  Have time to cancel appointment.   Wants to know about a good podiatrist for orthotics.   Review of Systems  Constitutional: Negative for fatigue.  Respiratory: Negative for shortness of breath.   Cardiovascular: Negative for chest pain, palpitations and leg swelling.  Gastrointestinal: Negative for abdominal pain, diarrhea, nausea and vomiting.  Musculoskeletal: Negative for arthralgias and myalgias.    Allergies Patient has No Known Allergies.  Past Medical History Patient  has a past medical history of Arthritis and TEAR, MEDIAL MENISCUS, KNEE, CURRENT (03/30/2007).  Surgical History Patient  has a past surgical history that includes Cyst removal trunk.  Family History Pateint's family history includes Hypertension in his brother and mother.  Social History Patient  reports that he has never smoked. He has never used smokeless tobacco. He reports that he does not drink alcohol or use drugs.    Objective: Vitals:   04/11/19 1352  BP: (!) 142/70  Pulse: (!) 57  Temp: 98.3 F (36.8 C)  TempSrc: Skin  SpO2: 99%  Weight: 177 lb 12.8 oz (80.6 kg)  Height: 5\' 11"  (1.803 m)    Body mass index is 24.8 kg/m.  Physical Exam     Assessment/plan: No charge. Knee issue resolved.   -referral placed for podiatrist.     No follow-ups on file.     Orma Flaming, MD [place]   04/11/2019

## 2019-04-16 ENCOUNTER — Other Ambulatory Visit: Payer: Self-pay

## 2019-04-16 ENCOUNTER — Ambulatory Visit: Payer: No Typology Code available for payment source

## 2019-04-16 ENCOUNTER — Encounter: Payer: Self-pay | Admitting: Podiatry

## 2019-04-16 ENCOUNTER — Ambulatory Visit (INDEPENDENT_AMBULATORY_CARE_PROVIDER_SITE_OTHER): Payer: No Typology Code available for payment source | Admitting: Podiatry

## 2019-04-16 VITALS — BP 152/87 | HR 59 | Resp 16

## 2019-04-16 DIAGNOSIS — M722 Plantar fascial fibromatosis: Secondary | ICD-10-CM | POA: Diagnosis not present

## 2019-04-17 NOTE — Progress Notes (Signed)
Subjective:   Patient ID: Joseph Boyle, male   DOB: 58 y.o.   MRN: [id number]   HPI 58 year old male presents the office today requesting orthotics.  He states he got orthotics about 10 years ago and at that time he felt that there were marbles in the bottom of his foot pointing to the plantar fascial and orthotics did resolve his pain.  Is currently denies any pain to his foot denies any swelling or redness.  He did not we will get x-rays today because he states he only wants orthotics he said no issues with his feet.   Review of Systems  All other systems reviewed and are negative.  Past Medical History:  Diagnosis Date  . Arthritis    patient denies  . TEAR, MEDIAL MENISCUS, KNEE, CURRENT 03/30/2007   patient denies    Past Surgical History:  Procedure Laterality Date  . CYST REMOVAL TRUNK     around 2005    No current outpatient medications on file.  No Known Allergies       Objective:  Physical Exam  General: AAO x3, NAD  Dermatological: Skin is warm, dry and supple bilateral. Nails x 10 are well manicured; remaining integument appears unremarkable at this time. There are no open sores, no preulcerative lesions, no rash or signs of infection present.  Vascular: Dorsalis Pedis artery and Posterior Tibial artery pedal pulses are 2/4 bilateral with immedate capillary fill time. There is no pain with calf compression, swelling, warmth, erythema.   Neruologic: Grossly intact via light touch bilateral.  Negative Tinel sign.  Musculoskeletal: Mild decrease in medial arch height.  There is no area tenderness.  Ankle, subtalar joint range of motion intact.  Muscular strength 5/5 in all groups tested bilateral.  Gait: Unassisted, Nonantalgic.       Assessment:   History of bilateral plantar fasciitis-requesting orthotics  Plan:  -Treatment options discussed including all alternatives, risks, and complications -Etiology of symptoms were discussed -He was measured with  orthotics today with Liliane Channel.  {Object of orthotics or sooner if needed.  Trula Slade DPM

## 2019-05-07 ENCOUNTER — Ambulatory Visit: Payer: No Typology Code available for payment source | Admitting: Orthotics

## 2019-05-07 ENCOUNTER — Other Ambulatory Visit: Payer: Self-pay

## 2019-05-07 DIAGNOSIS — M722 Plantar fascial fibromatosis: Secondary | ICD-10-CM

## 2019-05-07 NOTE — Progress Notes (Signed)
Patient came in today to pick up custom made foot orthotics.  The goals were accomplished and the patient reported no dissatisfaction with said orthotics.  Patient was advised of breakin period and how to report any issues. 

## 2019-05-21 ENCOUNTER — Ambulatory Visit: Payer: No Typology Code available for payment source | Admitting: Orthotics

## 2019-05-21 ENCOUNTER — Other Ambulatory Visit: Payer: Self-pay

## 2019-05-21 DIAGNOSIS — M722 Plantar fascial fibromatosis: Secondary | ICD-10-CM

## 2019-05-21 NOTE — Progress Notes (Signed)
Change device cushioned sport, P-light cover 1.5 and 1.5 p-cell

## 2019-10-06 ENCOUNTER — Ambulatory Visit: Payer: No Typology Code available for payment source | Attending: Internal Medicine

## 2019-10-06 DIAGNOSIS — Z23 Encounter for immunization: Secondary | ICD-10-CM

## 2019-10-06 NOTE — Progress Notes (Signed)
   Covid-19 Vaccination Clinic  Name:  Joseph Boyle    MRN: [id number] DOB: 1961/01/16  10/06/2019  Joseph Boyle was observed post Covid-19 immunization for 15 minutes without incident. He was provided with Vaccine Information Sheet and instruction to access the V-Safe system.   Joseph Boyle was instructed to call 911 with any severe reactions post vaccine: Marland Kitchen Difficulty breathing  . Swelling of face and throat  . A fast heartbeat  . A bad rash all over body  . Dizziness and weakness   Immunizations Administered    Name Date Dose VIS Date Route   Pfizer COVID-19 Vaccine 10/06/2019 11:08 AM 0.3 mL 06/01/2019 Intramuscular   Manufacturer: [employer], [employer]   Lot: [id number]   NDC: [address]-2

## 2019-10-30 ENCOUNTER — Ambulatory Visit: Payer: No Typology Code available for payment source | Attending: Internal Medicine

## 2019-10-30 DIAGNOSIS — Z23 Encounter for immunization: Secondary | ICD-10-CM

## 2019-10-30 NOTE — Progress Notes (Signed)
   Covid-19 Vaccination Clinic  Name:  NIKOLAI WILCZAK    MRN: [id number] DOB: March 24, 1961  10/30/2019  Mr. Carpenter was observed post Covid-19 immunization for 15 minutes without incident. He was provided with Vaccine Information Sheet and instruction to access the V-Safe system.   Mr. Jozwiak was instructed to call 911 with any severe reactions post vaccine: Marland Kitchen Difficulty breathing  . Swelling of face and throat  . A fast heartbeat  . A bad rash all over body  . Dizziness and weakness   Immunizations Administered    Name Date Dose VIS Date Route   Pfizer COVID-19 Vaccine 10/30/2019  3:24 PM 0.3 mL 08/15/2018 Intramuscular   Manufacturer: [employer], [employer]   Lot: [id number]   NDC: [address]-9

## 2021-08-11 ENCOUNTER — Telehealth: Payer: Self-pay | Admitting: Family Medicine

## 2021-08-11 DIAGNOSIS — M25512 Pain in left shoulder: Secondary | ICD-10-CM

## 2021-08-11 NOTE — Telephone Encounter (Signed)
Pt would like a PT referral for his Left Shoulder and Arm. Pt had a fall about 1 year ago. Please call pt for more info.

## 2021-08-11 NOTE — Telephone Encounter (Signed)
Referral has been submitted

## 2021-08-25 ENCOUNTER — Encounter: Payer: Self-pay | Admitting: Physical Therapy

## 2021-08-25 ENCOUNTER — Ambulatory Visit: Payer: Self-pay | Admitting: Physical Therapy

## 2021-08-25 DIAGNOSIS — M25512 Pain in left shoulder: Secondary | ICD-10-CM | POA: Diagnosis not present

## 2021-08-25 DIAGNOSIS — M79602 Pain in left arm: Secondary | ICD-10-CM | POA: Diagnosis not present

## 2021-08-25 DIAGNOSIS — G8929 Other chronic pain: Secondary | ICD-10-CM | POA: Diagnosis not present

## 2021-08-25 NOTE — Therapy (Signed)
?OUTPATIENT PHYSICAL THERAPY SHOULDER EVALUATION ? ? ?Patient Name: Joseph Boyle ?MRN: [id number] ?DOB:08-17-1960, 61 y.o., male ?Today's Date: 08/25/2021 ? ? PT End of Session - 08/25/21 1142   ? ? Visit Number 1   ? Number of Visits 12   ? Date for PT Re-Evaluation 10/06/21   ? PT Start Time [phone]   ? PT Stop Time 0933   ? PT Time Calculation (min) 46 min   ? Activity Tolerance Patient tolerated treatment well   ? Behavior During Therapy [facility] for tasks assessed/performed   ? ?  ?  ? ?  ? ? ?Past Medical History:  ?Diagnosis Date  ? Arthritis   ? patient denies  ? TEAR, MEDIAL MENISCUS, KNEE, CURRENT 03/30/2007  ? patient denies  ? ?Past Surgical History:  ?Procedure Laterality Date  ? CYST REMOVAL TRUNK    ? around 2005  ? ?Patient Active Problem List  ? Diagnosis Date Noted  ? History of adenomatous polyp of colon 09/18/2015  ? Essential hypertension 09/18/2015  ? Hyperlipidemia 03/28/2014  ? ALLERGIC RHINITIS 01/17/2007  ? ? ?PCP: Shelva Majestic, MD ? ?REFERRING PROVIDER: Shelva Majestic, MD ? ?REFERRING DIAG: L shoulder pain ? ?THERAPY DIAG:  ?Pain in left arm ? ?Chronic left shoulder pain ? ? ?ONSET DATE: 1 year ago ? ?SUBJECTIVE:                                                                                                                                                                                     ? ?SUBJECTIVE STATEMENT: ?Pt states tingling into L UE, down into fingers 1-3. He had a fall about a year ago on arm, and states tingling for about 6-8 months. He reports no pain, just tingling. Has some tightness/Soreness into pec and post shoulder. No tingling with activity, movement, mostly with Increased pressure on shoulder, leaning/weight bearing on arm.  ?Pt is self employed, heating and air conditioning. Denies weakness, is R handed. No Neck pain, no previous shoulder injury. ? ? ?PERTINENT HISTORY: ?None ? ?PAIN:  ?Are you having pain? Yes no pain, just tingling ?NPRS scale: up to 7/10 ?Pain  location: L arm ?Pain orientation: Left  ?PAIN TYPE: tingling ?Pain description: intermittent  ?Aggravating factors: leaning on L arm ?Relieving factors: position change ? ?PRECAUTIONS: None ? ?WEIGHT BEARING RESTRICTIONS No ? ?FALLS:  ?Has patient fallen in last 6 months? No Number of falls: 0    ? ? ?PLOF: Independent ? ?PATIENT GOALS:   Decreased tingling in L UE  ? ?OBJECTIVE:  ? ?COGNITION: ? Overall cognitive status: Within functional limits for tasks assessed ?    ? ?POSTURE: ?Unremarkable  ? ? ?  ROM:  Shoulder: WFL bil;  elbow: WFL bil ? ?Strength: Shoulders: 4+/5 bil;  ? ?SHOULDER SPECIAL TESTS: ? Neg ULTT, Neg neck distraction, Neg spirling, Neg Load shift, Neg shoulder scour.  ? ?PALPATION:  ?Tightness and trigger points at L posterior shoulder in Infraspinatus and teres, Palpation and TPR here reproduces tingling into fingers.  ?Tightness and tenderness in L pec. ? ?  ?TODAY'S TREATMENT: ? ?08/25/21:   ?Ther Ex: Posterior shoulder stretch 30 sec x 2; Supine pec stretch with nerve glides x 15; Pec stretch in doorway 30 sec x 3 at 90 deg;  ? Manual: DTM/ TPR to L Infraspinatus and teres , LAD to L shoulder,  ? ? ?PATIENT EDUCATION: ?Education details: PT POC, Exam findings, HEP ?Person educated: Patient ?Education method: Explanation, Demonstration, Tactile cues, Verbal cues, and Handouts ?Education comprehension: verbalized understanding, returned demonstration, verbal cues required, tactile cues required, and needs further education ? ? ?HOME EXERCISE PROGRAM: ?Access Code: [email or web address] ?URL: [email or web address] ?Date: 08/25/2021 ?Prepared by: Sedalia Muta ? ?Exercises ?Supine Pectoralis Stretch - 2 x daily - 3 reps - 30 hold ?Doorway Pec Stretch at 90 Degrees Abduction - 2 x daily - 3 reps - 30 hold ?Standing 'L' Stretch at Counter - 2 x daily - 3 reps - 30 hold ?Standing Shoulder Posterior Capsule Stretch - 2 x daily - 3 reps - 30 hold ? ? ?ASSESSMENT: ? ?CLINICAL IMPRESSION: ?Pt with  primary complaint of tingling in UE. Pain more difficult to reproduce, but does have most reproduction of symptoms with palpation of Infraspinatus and teres muscles. Pt has minimal pain, but increased tingling with certain arm positions. Elbow and neck testing negative today. Likely stemming from shoulder muscles-pt to benefit from manual release to improve pain. Other possibility would be muscle pathology, but he does have good strength there. Pt to benefit from skilled PT to improve pain, tingling, and deficits. ? ? ?OBJECTIVE IMPAIRMENTS decreased activity tolerance, increased muscle spasms, and pain.  ? ?ACTIVITY LIMITATIONS cleaning, community activity, occupation, and yard work.  ? ?PERSONAL FACTORS  none  are also affecting patient's functional outcome.  ? ? ?REHAB POTENTIAL: Good ? ?CLINICAL DECISION MAKING: Stable/uncomplicated ? ?EVALUATION COMPLEXITY: Low ? ? ?GOALS: ?Goals reviewed with patient? Yes ? ?SHORT TERM GOALS: ? ?Pt to be independent with initial HEP ?Target date: 09/08/2021 ?Goal status: INITIAL ? ?2.  Pt to report decreased tingling into L UE to 4/10 ?Target date: 09/08/2021 ?Goal status: INITIAL ? ? ?LONG TERM GOALS: ? ?Pt to be independent with final HEP ?Target date: 10/06/2021 ?Goal status: INITIAL ? ?2.  Pt to report decreased tingling in L UE to 0-2/10  ?Target date: 10/06/2021 ?Goal status: INITIAL ? ?3.  Pt to demo decreased muscle tension and pain in L posterior shoulder with palpation. ?Target date: 10/06/2021 ?Goal status: INITIAL ? ? ? ?PLAN: ?PT FREQUENCY: 1-2x/week ? ?PT DURATION: 6 weeks ? ?PLANNED INTERVENTIONS: Therapeutic exercises, Therapeutic activity, Neuromuscular re-education, Patient/Family education, Joint manipulation, Joint mobilization, DME instructions, Dry Needling, Electrical stimulation, Spinal manipulation, Spinal mobilization, Cryotherapy, Moist heat, Taping, Vasopneumatic device, Traction, Ultrasound, Ionotophoresis 4mg /ml Dexamethasone, and Manual  therapy ? ?PLAN FOR NEXT SESSION:  ? ? ? , PT, DPT ?11:50 AM  08/25/21 ? ? ?

## 2021-09-03 ENCOUNTER — Encounter: Payer: Self-pay | Admitting: Physical Therapy

## 2021-09-03 ENCOUNTER — Ambulatory Visit (INDEPENDENT_AMBULATORY_CARE_PROVIDER_SITE_OTHER): Payer: PRIVATE HEALTH INSURANCE | Admitting: Physical Therapy

## 2021-09-03 DIAGNOSIS — M25512 Pain in left shoulder: Secondary | ICD-10-CM

## 2021-09-03 DIAGNOSIS — M79602 Pain in left arm: Secondary | ICD-10-CM

## 2021-09-03 DIAGNOSIS — G8929 Other chronic pain: Secondary | ICD-10-CM

## 2021-09-03 NOTE — Therapy (Signed)
?OUTPATIENT PHYSICAL THERAPY TREATMENT ? ? ?Patient Name: Joseph Boyle ?MRN: [id number] ?DOB:1960-10-22, 61 y.o., male ?Today's Date: 09/03/2021 ? ? PT End of Session - 09/03/21 1144   ? ? Visit Number 2   ? Number of Visits 12   ? Date for PT Re-Evaluation 10/06/21   ? PT Start Time 1106   ? PT Stop Time 1144   ? PT Time Calculation (min) 38 min   ? Activity Tolerance Patient tolerated treatment well   ? Behavior During Therapy [facility] for tasks assessed/performed   ? ?  ?  ? ?  ? ? ? ?Past Medical History:  ?Diagnosis Date  ? Arthritis   ? patient denies  ? TEAR, MEDIAL MENISCUS, KNEE, CURRENT 03/30/2007  ? patient denies  ? ?Past Surgical History:  ?Procedure Laterality Date  ? CYST REMOVAL TRUNK    ? around 2005  ? ?Patient Active Problem List  ? Diagnosis Date Noted  ? History of adenomatous polyp of colon 09/18/2015  ? Essential hypertension 09/18/2015  ? Hyperlipidemia 03/28/2014  ? ALLERGIC RHINITIS 01/17/2007  ? ? ?PCP: Shelva Majestic, MD ? ?REFERRING PROVIDER: Shelva Majestic, MD ? ?REFERRING DIAG: L shoulder pain ? ?THERAPY DIAG:  ?Pain in left arm ? ?Chronic left shoulder pain ? ? ?ONSET DATE: 1 year ago ? ?SUBJECTIVE:                                                                                                                                                                                     ? ?SUBJECTIVE STATEMENT: ?Pt states minimal change in symptoms, Still tingling in arm at times ? ? ?PERTINENT HISTORY: ?None ? ?PAIN:  ?Are you having pain? Yes no pain, just tingling ?NPRS scale: up to 7/10 ?Pain location: L arm ?Pain orientation: Left  ?PAIN TYPE: tingling ?Pain description: intermittent  ?Aggravating factors: leaning on L arm ?Relieving factors: position change ? ?PRECAUTIONS: None ? ?WEIGHT BEARING RESTRICTIONS No ? ?FALLS:  ?Has patient fallen in last 6 months? No Number of falls: 0    ? ? ?PLOF: Independent ? ?PATIENT GOALS:   Decreased tingling in L UE  ? ?OBJECTIVE:   ? ?COGNITION: ? Overall cognitive status: Within functional limits for tasks assessed ?    ? ?POSTURE: ?Unremarkable  ? ? ?ROM:  Shoulder: WFL bil;  elbow: WFL bil ? ?Strength: Shoulders: 4+/5 bil;  ? ?SHOULDER SPECIAL TESTS: ? Neg ULTT, Neg neck distraction, Neg spirling, Neg Load shift, Neg shoulder scour.  ? ?PALPATION:  ?Tightness and trigger points at L posterior shoulder in Infraspinatus and teres, Palpation and TPR here reproduces tingling into fingers.  ?Tightness and tenderness in  L pec. ? ?  ?TODAY'S TREATMENT: ? ?09/03/21: ?Therapeutic Exercise: ?Aerobic: ?Supine:  SA press 5lb x 20 bil;  ?Seated: ?Standing: Rows Blue TB x 20; IR/ER Blue TB x 20 ea; Wall push ups x 20;  ?Stretches: Post shoulder stretch 30 sec x 3;  Lat stretch at rail x1 min;  ER stretch at rail x 1 min; Doorway at 90 deg 30 sec x 3;  ?Neuromuscular Re-education: ?Manual Therapy:  DTM/ TPR/IASTM/cupping to L Infraspinatus and teres ,   ? ? ?08/25/21:   ?Ther Ex: Posterior shoulder stretch 30 sec x 2; Supine pec stretch with nerve glides x 15; Pec stretch in doorway 30 sec x 3 at 90 deg;  ? Manual: DTM/ TPR to L Infraspinatus and teres , LAD to L shoulder,  ? ? ?PATIENT EDUCATION: ?Education details: reviewed HEP ?Person educated: Patient ?Education method: Explanation, Demonstration, Tactile cues, Verbal cues, and Handouts ?Education comprehension: verbalized understanding, returned demonstration, verbal cues required, tactile cues required, and needs further education ? ? ?HOME EXERCISE PROGRAM: ?Access Code: [email or web address] ? ? ? ?ASSESSMENT: ? ?CLINICAL IMPRESSION: ?Pt with noted onset of symptoms with IR ROM and strengthening today, as well as with deep palpation and tissue release. He reports no pain symptoms, but just tingling. Added light strengthening as tolerated today. Will continue to focus on posterior shoulder, teres and infra for manual and strengthening. ? ? ?OBJECTIVE IMPAIRMENTS decreased activity tolerance, increased muscle  spasms, and pain.  ? ?ACTIVITY LIMITATIONS cleaning, community activity, occupation, and yard work.  ? ?PERSONAL FACTORS  none  are also affecting patient's functional outcome.  ? ? ?REHAB POTENTIAL: Good ? ?CLINICAL DECISION MAKING: Stable/uncomplicated ? ?EVALUATION COMPLEXITY: Low ? ? ?GOALS: ?Goals reviewed with patient? Yes ? ?SHORT TERM GOALS: ? ?Pt to be independent with initial HEP ?Target date: 09/17/2021 ?Goal status: INITIAL ? ?2.  Pt to report decreased tingling into L UE to 4/10 ?Target date: 09/17/2021 ?Goal status: INITIAL ? ? ?LONG TERM GOALS: ? ?Pt to be independent with final HEP ?Target date: 10/15/2021 ?Goal status: INITIAL ? ?2.  Pt to report decreased tingling in L UE to 0-2/10  ?Target date: 10/15/2021 ?Goal status: INITIAL ? ?3.  Pt to demo decreased muscle tension and pain in L posterior shoulder with palpation. ?Target date: 10/15/2021 ?Goal status: INITIAL ? ? ? ?PLAN: ?PT FREQUENCY: 1-2x/week ? ?PT DURATION: 6 weeks ? ?PLANNED INTERVENTIONS: Therapeutic exercises, Therapeutic activity, Neuromuscular re-education, Patient/Family education, Joint manipulation, Joint mobilization, DME instructions, Dry Needling, Electrical stimulation, Spinal manipulation, Spinal mobilization, Cryotherapy, Moist heat, Taping, Vasopneumatic device, Traction, Ultrasound, Ionotophoresis 4mg /ml Dexamethasone, and Manual therapy ? ?PLAN FOR NEXT SESSION:  ? ? ? , PT, DPT ?11:52 AM  09/03/21 ? ? ?

## 2021-09-10 ENCOUNTER — Ambulatory Visit: Payer: PRIVATE HEALTH INSURANCE | Admitting: Physical Therapy

## 2021-09-10 DIAGNOSIS — M25512 Pain in left shoulder: Secondary | ICD-10-CM | POA: Diagnosis not present

## 2021-09-10 DIAGNOSIS — M79602 Pain in left arm: Secondary | ICD-10-CM | POA: Diagnosis not present

## 2021-09-10 DIAGNOSIS — G8929 Other chronic pain: Secondary | ICD-10-CM | POA: Diagnosis not present

## 2021-09-10 NOTE — Therapy (Signed)
?OUTPATIENT PHYSICAL THERAPY TREATMENT ? ? ?Patient Name: Joseph Boyle ?MRN: [id number] ?DOB:11/27/1960, 61 y.o., male ?Today's Date: 09/10/2021 ? ? PT End of Session - 09/11/21 1058   ? ? Visit Number 3   ? Number of Visits 12   ? Date for PT Re-Evaluation 10/06/21   ? PT Start Time 1102   ? PT Stop Time 1144   ? PT Time Calculation (min) 42 min   ? Activity Tolerance Patient tolerated treatment well   ? Behavior During Therapy [facility] for tasks assessed/performed   ? ?  ?  ? ?  ? ? ? ? ?Past Medical History:  ?Diagnosis Date  ? Arthritis   ? patient denies  ? TEAR, MEDIAL MENISCUS, KNEE, CURRENT 03/30/2007  ? patient denies  ? ?Past Surgical History:  ?Procedure Laterality Date  ? CYST REMOVAL TRUNK    ? around 2005  ? ?Patient Active Problem List  ? Diagnosis Date Noted  ? History of adenomatous polyp of colon 09/18/2015  ? Essential hypertension 09/18/2015  ? Hyperlipidemia 03/28/2014  ? ALLERGIC RHINITIS 01/17/2007  ? ? ?PCP: Shelva Majestic, MD ? ?REFERRING PROVIDER: Shelva Majestic, MD ? ?REFERRING DIAG: L shoulder pain ? ?THERAPY DIAG:  ?Pain in left arm ? ?Chronic left shoulder pain ? ? ?ONSET DATE: 1 year ago ? ?SUBJECTIVE:                                                                                                                                                                                     ? ?SUBJECTIVE STATEMENT: ?Pt states minimal pain yesterday, did feel it one time today. Thinks overall it is less painful. Did get a massage this week with focus on L posterior shoulder.  ? ? ?PERTINENT HISTORY: ?None ? ?PAIN:  ?Are you having pain? Yes no pain, just tingling ?NPRS scale: up to 7/10 ?Pain location: L arm ?Pain orientation: Left  ?PAIN TYPE: tingling ?Pain description: intermittent  ?Aggravating factors: leaning on L arm ?Relieving factors: position change ? ?PRECAUTIONS: None ? ?WEIGHT BEARING RESTRICTIONS No ? ?FALLS:  ?Has patient fallen in last 6 months? No Number of falls: 0    ? ? ?PLOF:  Independent ? ?PATIENT GOALS:   Decreased tingling in L UE  ? ?OBJECTIVE:  ? ?COGNITION: ? Overall cognitive status: Within functional limits for tasks assessed ?    ? ?POSTURE: ?Unremarkable  ? ? ?ROM:  Shoulder: WFL bil;  elbow: WFL bil ? ?Strength: Shoulders: 4+/5 bil;  ? ?SHOULDER SPECIAL TESTS: ? Neg ULTT, Neg neck distraction, Neg spirling, Neg Load shift, Neg shoulder scour.  ? ?PALPATION:  ?Tightness and trigger points at L  posterior shoulder in Infraspinatus and teres, Palpation and TPR here reproduces tingling into fingers.  ?Tightness and tenderness in L pec. ? ?  ?TODAY'S TREATMENT: ?09/10/21: ?Therapeutic Exercise: ?Aerobic: ?Supine: Quadruped SA presses x 20;  ?Seated:  ?Standing: Rows Blue TB x 20; IR/ER Blue TB x 20 ea; Wall push ups x 20; UE lift offs at wall x 15;  ?Stretches: Post shoulder stretch 30 sec x 3;  Lat stretch at rail x1 min;  ER stretch at rail x 1 min; Doorway at 90 deg 30 sec x 3;  ?Neuromuscular Re-education: ?Manual Therapy:  DTM/ TPR/IASTM to L Lat, Infraspinatus and teres ,   ? ? ?09/03/21: ?Therapeutic Exercise: ?Aerobic: ?Supine:  SA press 5lb x 20 bil;  ?Seated: ?Standing: Rows Blue TB x 20; IR/ER Blue TB x 20 ea; Wall push ups x 20;  ?Stretches: Post shoulder stretch 30 sec x 3;  Lat stretch at rail x1 min;  ER stretch at rail x 1 min; Doorway at 90 deg 30 sec x 3;  ?Neuromuscular Re-education: ?Manual Therapy:  DTM/ TPR/IASTM/cupping to L Infraspinatus and teres ,   ? ? ?08/25/21:   ?Ther Ex: Posterior shoulder stretch 30 sec x 2; Supine pec stretch with nerve glides x 15; Pec stretch in doorway 30 sec x 3 at 90 deg;  ? Manual: DTM/ TPR to L Infraspinatus and teres , LAD to L shoulder,  ? ? ?PATIENT EDUCATION: ?Education details: reviewed HEP ?Person educated: Patient ?Education method: Explanation, Demonstration, Tactile cues, Verbal cues, and Handouts ?Education comprehension: verbalized understanding, returned demonstration, verbal cues required, tactile cues required, and  needs further education ? ? ?HOME EXERCISE PROGRAM: ?Access Code: [email or web address] ? ? ? ?ASSESSMENT: ? ?CLINICAL IMPRESSION: ?Pt with much less aggravation of symptoms with palpation and manual today. He does have reproduction of symptoms/tingling with IR strengthening and IR ROM. Pt also has scar superior to painful musculature-states he had a cyst removed around 2011 that was "attached to the muscle" when he had it removed. Other possible pain source would be a cyst that is occupying space and creating tingling in this region. Pt with decreasing pain levels today, will continue to with current treatment plan for decreasing muscle trigger points and strengthening.  ? ? ?OBJECTIVE IMPAIRMENTS decreased activity tolerance, increased muscle spasms, and pain.  ? ?ACTIVITY LIMITATIONS cleaning, community activity, occupation, and yard work.  ? ?PERSONAL FACTORS  none  are also affecting patient's functional outcome.  ? ? ?REHAB POTENTIAL: Good ? ?CLINICAL DECISION MAKING: Stable/uncomplicated ? ?EVALUATION COMPLEXITY: Low ? ? ?GOALS: ?Goals reviewed with patient? Yes ? ?SHORT TERM GOALS: ? ?Pt to be independent with initial HEP ?Target date: 09/25/2021 ?Goal status: INITIAL ? ?2.  Pt to report decreased tingling into L UE to 4/10 ?Target date: 09/25/2021 ?Goal status: INITIAL ? ? ?LONG TERM GOALS: ? ?Pt to be independent with final HEP ?Target date: 10/23/2021 ?Goal status: INITIAL ? ?2.  Pt to report decreased tingling in L UE to 0-2/10  ?Target date: 10/23/2021 ?Goal status: INITIAL ? ?3.  Pt to demo decreased muscle tension and pain in L posterior shoulder with palpation. ?Target date: 10/23/2021 ?Goal status: INITIAL ? ? ? ?PLAN: ?PT FREQUENCY: 1-2x/week ? ?PT DURATION: 6 weeks ? ?PLANNED INTERVENTIONS: Therapeutic exercises, Therapeutic activity, Neuromuscular re-education, Patient/Family education, Joint manipulation, Joint mobilization, DME instructions, Dry Needling, Electrical stimulation, Spinal manipulation, Spinal  mobilization, Cryotherapy, Moist heat, Taping, Vasopneumatic device, Traction, Ultrasound, Ionotophoresis 4mg /ml Dexamethasone, and Manual therapy ? ?PLAN FOR NEXT SESSION:  ? ? ?  Sedalia MutaLauren Abbegail Matuska, PT, DPT ?10:59 AM  09/11/21 ? ? ?

## 2021-09-11 ENCOUNTER — Encounter: Payer: Self-pay | Admitting: Physical Therapy

## 2021-09-17 ENCOUNTER — Encounter: Payer: PRIVATE HEALTH INSURANCE | Admitting: Physical Therapy

## 2021-09-24 ENCOUNTER — Ambulatory Visit: Payer: PRIVATE HEALTH INSURANCE | Admitting: Physical Therapy

## 2021-09-24 ENCOUNTER — Encounter: Payer: Self-pay | Admitting: Physical Therapy

## 2021-09-24 DIAGNOSIS — G8929 Other chronic pain: Secondary | ICD-10-CM

## 2021-09-24 DIAGNOSIS — M25512 Pain in left shoulder: Secondary | ICD-10-CM

## 2021-09-24 DIAGNOSIS — M79602 Pain in left arm: Secondary | ICD-10-CM

## 2021-09-24 NOTE — Therapy (Signed)
?OUTPATIENT PHYSICAL THERAPY TREATMENT ? ? ?Patient Name: Joseph Boyle ?MRN: [id number] ?DOB:January 01, 1961, 61 y.o., male ?Today's Date: 09/24/2021 ? ? ? PT End of Session - 09/24/21 1640   ? ? Visit Number 4   ? Number of Visits 12   ? Date for PT Re-Evaluation 10/06/21   ? PT Start Time 1104   ? PT Stop Time 1144   ? PT Time Calculation (min) 40 min   ? Activity Tolerance Patient tolerated treatment well   ? Behavior During Therapy [facility] for tasks assessed/performed   ? ?  ?  ? ?  ? ? ? ? ? ?Past Medical History:  ?Diagnosis Date  ? Arthritis   ? patient denies  ? TEAR, MEDIAL MENISCUS, KNEE, CURRENT 03/30/2007  ? patient denies  ? ?Past Surgical History:  ?Procedure Laterality Date  ? CYST REMOVAL TRUNK    ? around 2005  ? ?Patient Active Problem List  ? Diagnosis Date Noted  ? History of adenomatous polyp of colon 09/18/2015  ? Essential hypertension 09/18/2015  ? Hyperlipidemia 03/28/2014  ? ALLERGIC RHINITIS 01/17/2007  ? ? ?PCP: Shelva Majestic, MD ? ?REFERRING PROVIDER: Shelva Majestic, MD ? ?REFERRING DIAG: L shoulder pain ? ?THERAPY DIAG:  ?Pain in left arm ? ?Chronic left shoulder pain ? ? ?ONSET DATE: 1 year ago ? ?SUBJECTIVE:                                                                                                                                                                                     ? ?SUBJECTIVE STATEMENT: ?Pt states very minimal pain since last visit, 2 weeks ago, has felt the tingling "about 2 times" . Overall much better.  ? ? ?PERTINENT HISTORY: ?None ? ?PAIN:  ?Are you having pain? Yes no pain, just tingling ?NPRS scale: up to 3/10 ?Pain location: L arm ?Pain orientation: Left  ?PAIN TYPE: tingling ?Pain description: intermittent  ?Aggravating factors: leaning on L arm ?Relieving factors: position change ? ?PRECAUTIONS: None ? ?WEIGHT BEARING RESTRICTIONS No ? ?FALLS:  ?Has patient fallen in last 6 months? No Number of falls: 0    ? ? ?PLOF: Independent ? ?PATIENT GOALS:    Decreased tingling in L UE  ? ?OBJECTIVE:  ? ?COGNITION: ? Overall cognitive status: Within functional limits for tasks assessed ?    ?POSTURE: ?Unremarkable  ? ?ROM:  Shoulder: WFL bil;  elbow: WFL bil ? ?Strength: Shoulders: 4+/5 bil;  ? ?SHOULDER SPECIAL TESTS: ? Neg ULTT, Neg neck distraction, Neg spirling, Neg Load shift, Neg shoulder scour.  ? ?PALPATION:  ?Tightness and trigger points at L posterior shoulder in Infraspinatus and teres, Palpation  and TPR here reproduces tingling into fingers.  ?Tightness and tenderness in L pec. ? ?  ?TODAY'S TREATMENT: ?09/24/21: ?Therapeutic Exercise: ?Aerobic: ?Supine: Quadruped SA presses x 20;  ?Seated:  ?Standing: Rows Black TB x 20; IR/ER Blue TB x 20 ea; Wall push ups x 20;   ?Stretches: Post shoulder stretch 30 sec x 3;  Lat stretch at rail x1 min;  Doorway at 90 deg 30 sec x 3;  ?Neuromuscular Re-education: ?Manual Therapy:  DTM/ TPR/IASTM to L Lat, Infraspinatus and teres , rhomboid  ? ? ? ?09/10/21: ?Therapeutic Exercise: ?Aerobic: ?Supine: Quadruped SA presses x 20;  ?Seated:  ?Standing: Rows Blue TB x 20; IR/ER Blue TB x 20 ea; Wall push ups x 20; UE lift offs at wall x 15;  ?Stretches: Post shoulder stretch 30 sec x 3;  Lat stretch at rail x1 min;  ER stretch at rail x 1 min; Doorway at 90 deg 30 sec x 3;  ?Neuromuscular Re-education: ?Manual Therapy:  DTM/ TPR/IASTM to L Lat, Infraspinatus and teres ,   ? ? ?09/03/21: ?Therapeutic Exercise: ?Aerobic: ?Supine:  SA press 5lb x 20 bil;  ?Seated: ?Standing: Rows Blue TB x 20; IR/ER Blue TB x 20 ea; Wall push ups x 20;  ?Stretches: Post shoulder stretch 30 sec x 3;  Lat stretch at rail x1 min;  ER stretch at rail x 1 min; Doorway at 90 deg 30 sec x 3;  ?Neuromuscular Re-education: ?Manual Therapy:  DTM/ TPR/IASTM/cupping to L Infraspinatus and teres ,   ? ? ? ?PATIENT EDUCATION: ?Education details: reviewed HEP ?Person educated: Patient ?Education method: Explanation, Demonstration, Tactile cues, Verbal cues, and  Handouts ?Education comprehension: verbalized understanding, returned demonstration, verbal cues required, tactile cues required, and needs further education ? ? ?HOME EXERCISE PROGRAM: ?Access Code: [email or web address] ? ? ? ?ASSESSMENT: ? ?CLINICAL IMPRESSION: ?Pt with decreasing pain and symptoms. Still with pain with deep palpation and manual today, reproducing symptoms, but less than previous sessions. Pt benefiting from manual muscle release. Plan to progress as tolerated. Declines dry needling today.  ? ? ?OBJECTIVE IMPAIRMENTS decreased activity tolerance, increased muscle spasms, and pain.  ? ?ACTIVITY LIMITATIONS cleaning, community activity, occupation, and yard work.  ? ?PERSONAL FACTORS  none  are also affecting patient's functional outcome.  ? ? ?REHAB POTENTIAL: Good ? ?CLINICAL DECISION MAKING: Stable/uncomplicated ? ?EVALUATION COMPLEXITY: Low ? ? ?GOALS: ?Goals reviewed with patient? Yes ? ?SHORT TERM GOALS: ? ?Pt to be independent with initial HEP ?Target date: 10/08/2021 ?Goal status: INITIAL ? ?2.  Pt to report decreased tingling into L UE to 4/10 ?Target date: 10/08/2021 ?Goal status: INITIAL ? ? ?LONG TERM GOALS: ? ?Pt to be independent with final HEP ?Target date: 11/05/2021 ?Goal status: INITIAL ? ?2.  Pt to report decreased tingling in L UE to 0-2/10  ?Target date: 11/05/2021 ?Goal status: INITIAL ? ?3.  Pt to demo decreased muscle tension and pain in L posterior shoulder with palpation. ?Target date: 11/05/2021 ?Goal status: INITIAL ? ? ? ?PLAN: ?PT FREQUENCY: 1-2x/week ? ?PT DURATION: 6 weeks ? ?PLANNED INTERVENTIONS: Therapeutic exercises, Therapeutic activity, Neuromuscular re-education, Patient/Family education, Joint manipulation, Joint mobilization, DME instructions, Dry Needling, Electrical stimulation, Spinal manipulation, Spinal mobilization, Cryotherapy, Moist heat, Taping, Vasopneumatic device, Traction, Ultrasound, Ionotophoresis 4mg /ml Dexamethasone, and Manual therapy ? ?PLAN FOR NEXT  SESSION:  ? ? ? , PT, DPT ?4:41 PM  09/24/21 ? ? ?

## 2021-10-01 ENCOUNTER — Encounter: Payer: PRIVATE HEALTH INSURANCE | Admitting: Physical Therapy

## 2021-10-28 NOTE — Progress Notes (Signed)
? ?I, Debbe OdeaBriana Collins, am serving as a Neurosurgeonscribe for Dr. Clementeen GrahamEvan Britta Louth. ? ?Subjective:   ? ?CC: back pain ? ?HPI: Pt is a 61 y/o male c/o back pain x 2-3 months. Pt locates pain to Left shoulder blade area that radiates down the left arm. Patient has been feeling tingling going down his arm, but saw a chiropractor and that helped with the tingling in the arm. Patient still has a nagging pain in the shoulder blade area. Patient states he did have a growth removed in that area that were imbedded in his nerves. Patient has been seeing laura for PT for a couple weeks. Patient does play a lot of tennis and the arm will feel weighted down.  ? ?Radiating pain: yes ?LE numbness/tingling: yes down left arm and in hands and fingers ?LE weakness: no ?Aggravates: pressure put on that area ?Treatments tried: PT, heat, and ice  ? ?Pertinent review of Systems: No fevers or chills ? ?Relevant historical information: Hypertension and hyperlipidemia ? ? ?Objective:   ? ?Vitals:  ? 10/29/21 0812  ?BP: 120/80  ?Pulse: (!) 51  ?SpO2: 96%  ? ?General: Well Developed, well nourished, and in no acute distress.  ? ?MSK: C-spine: Nontender midline.  Decreased cervical motion.  Positive Spurling's test. ?Upper extremity strength is intact. ?T-spine: Nontender midline. ?Tender palpation left rhomboid area. ? ?Lab and Radiology Results ? ?X-ray images C-spine and T-spine obtained today personally and independently interpreted ? 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  No acute fractures are present. ? ?T-spine: Wedge-shaped appearance of mid thoracic vertebral body.  Does not appear to be acute compression fracture. ? ?Await formal radiology review ? ? ? ?Impression and Recommendations:   ? ?Assessment and Plan: ?61 y.o. male with pain located in the left rhomboid region with a recent history of paresthesias radiating down the left arm in a C7 or C8 dermatomal pattern. ?He has had an extensive trial of physical therapy recently for his presumed  rhomboid muscle pain that did not improve sufficiently. ?I am able to reproduce or exacerbate his rhomboid pain with cervical neck motion and Spurling's test. ?Suspect this is cervical radiculopathy.  Plan for MRI C-spine and either directly order epidural steroid injection or recheck back in clinic. ? ?PDMP not reviewed this encounter. ?Orders Placed This Encounter  ?Procedures  ? DG Cervical Spine 2 or 3 views  ?  Standing Status:   Future  ?  Number of Occurrences:   1  ?  Standing Expiration Date:   10/30/2022  ?  Order Specific Question:   Reason for Exam (SYMPTOM  OR DIAGNOSIS REQUIRED)  ?  Answer:   Neck pain  ?  Order Specific Question:   Preferred imaging location?  ?  [facility]  ? DG Thoracic Spine 2 View  ?  Standing Status:   Future  ?  Number of Occurrences:   1  ?  Standing Expiration Date:   10/30/2022  ?  Order Specific Question:   Reason for Exam (SYMPTOM  OR DIAGNOSIS REQUIRED)  ?  Answer:   thoracic back pain  ?  Order Specific Question:   Preferred imaging location?  ?  [facility]  ? MR CERVICAL SPINE WO CONTRAST  ?  Standing Status:   Future  ?  Standing Expiration Date:   10/30/2022  ?  Order Specific Question:   What is the patient's sedation requirement?  ?  Answer:  No Sedation  ?  Order Specific Question:   Does the patient have a pacemaker or implanted devices?  ?  Answer:   No  ?  Order Specific Question:   Preferred imaging location?  ?  Answer:   Product/process development scientist (table limit-350lbs)  ? ?No orders of the defined types were placed in this encounter. ? ? ?Discussed warning signs or symptoms. Please see discharge instructions. Patient expresses understanding. ? ? ?The above documentation has been reviewed and is accurate and complete Lynne Leader, M.D. ? ?

## 2021-10-29 ENCOUNTER — Ambulatory Visit: Payer: PRIVATE HEALTH INSURANCE | Admitting: Family Medicine

## 2021-10-29 ENCOUNTER — Ambulatory Visit (INDEPENDENT_AMBULATORY_CARE_PROVIDER_SITE_OTHER): Payer: PRIVATE HEALTH INSURANCE

## 2021-10-29 VITALS — BP 120/80 | HR 51 | Ht 71.0 in | Wt 183.0 lb

## 2021-10-29 DIAGNOSIS — M5010 Cervical disc disorder with radiculopathy, unspecified cervical region: Secondary | ICD-10-CM | POA: Diagnosis not present

## 2021-10-29 NOTE — Patient Instructions (Addendum)
Good to see you ?I believe you have cervical spine radiculopathy  ?Get x rays on your way out ?MRI ordered for [facility] they will call you to schedule  ?Recheck after MRI ?

## 2021-11-02 NOTE — Progress Notes (Signed)
Thoracic spine x-ray looks normal to radiology.

## 2021-11-02 NOTE — Progress Notes (Signed)
Cervical spine x-ray shows multilevel arthritis changes.

## 2022-02-04 ENCOUNTER — Ambulatory Visit (INDEPENDENT_AMBULATORY_CARE_PROVIDER_SITE_OTHER): Payer: PRIVATE HEALTH INSURANCE | Admitting: Family Medicine

## 2022-02-04 ENCOUNTER — Encounter: Payer: Self-pay | Admitting: Family Medicine

## 2022-02-04 VITALS — BP 130/76 | HR 65 | Temp 98.3°F | Ht 71.0 in | Wt 179.8 lb

## 2022-02-04 DIAGNOSIS — Z86010 Personal history of colonic polyps: Secondary | ICD-10-CM | POA: Diagnosis not present

## 2022-02-04 DIAGNOSIS — Z0000 Encounter for general adult medical examination without abnormal findings: Secondary | ICD-10-CM | POA: Diagnosis not present

## 2022-02-04 DIAGNOSIS — Z125 Encounter for screening for malignant neoplasm of prostate: Secondary | ICD-10-CM | POA: Diagnosis not present

## 2022-02-04 DIAGNOSIS — E785 Hyperlipidemia, unspecified: Secondary | ICD-10-CM | POA: Diagnosis not present

## 2022-02-04 LAB — COMPREHENSIVE METABOLIC PANEL
ALT: 17 U/L (ref 0–53)
AST: 22 U/L (ref 0–37)
Albumin: 4.3 g/dL (ref 3.5–5.2)
Alkaline Phosphatase: 69 U/L (ref 39–117)
BUN: 13 mg/dL (ref 6–23)
CO2: 29 mEq/L (ref 19–32)
Calcium: 9.1 mg/dL (ref 8.4–10.5)
Chloride: 101 mEq/L (ref 96–112)
Creatinine, Ser: 1.17 mg/dL (ref 0.40–1.50)
GFR: 66.57 mL/min (ref >60.00)
Glucose, Bld: 83 mg/dL (ref 70–99)
Potassium: 4.8 mEq/L (ref 3.5–5.1)
Sodium: 138 mEq/L (ref 135–145)
Total Bilirubin: 1 mg/dL (ref 0.2–1.2)
Total Protein: 6.8 g/dL (ref 6.0–8.3)

## 2022-02-04 LAB — CBC WITH DIFFERENTIAL/PLATELET
Basophils Absolute: 0.1 10*3/uL (ref 0.0–0.1)
Basophils Relative: 1.5 % (ref 0.0–3.0)
Eosinophils Absolute: 0.2 10*3/uL (ref 0.0–0.7)
Eosinophils Relative: 5.1 % — ABNORMAL HIGH (ref 0.0–5.0)
HCT: 43.8 % (ref 39.0–52.0)
Hemoglobin: 14.3 g/dL (ref 13.0–17.0)
Lymphocytes Relative: 23.8 % (ref 12.0–46.0)
Lymphs Abs: 0.9 10*3/uL (ref 0.7–4.0)
MCHC: 32.6 g/dL (ref 30.0–36.0)
MCV: 95.9 fl (ref 78.0–100.0)
Monocytes Absolute: 0.4 10*3/uL (ref 0.1–1.0)
Monocytes Relative: 11.7 % (ref 3.0–12.0)
Neutro Abs: 2.1 10*3/uL (ref 1.4–7.7)
Neutrophils Relative %: 57.9 % (ref 43.0–77.0)
Platelets: 194 10*3/uL (ref 150.0–400.0)
RBC: 4.57 Mil/uL (ref 4.22–5.81)
RDW: 14.3 % (ref 11.5–15.5)
WBC: 3.6 10*3/uL — ABNORMAL LOW (ref 4.0–10.5)

## 2022-02-04 LAB — LIPID PANEL
Cholesterol: 216 mg/dL — ABNORMAL HIGH (ref 0–200)
HDL: 57.2 mg/dL (ref >39.00)
LDL Cholesterol: 144 mg/dL — ABNORMAL HIGH (ref 0–99)
NonHDL: 158.46
Total CHOL/HDL Ratio: 4
Triglycerides: 70 mg/dL (ref 0.0–149.0)
VLDL: 14 mg/dL (ref 0.0–40.0)

## 2022-02-04 LAB — PSA: PSA: 0.81 ng/mL (ref 0.10–4.00)

## 2022-02-04 NOTE — Progress Notes (Signed)
Phone: [phone]   Subjective:  Patient presents today for their annual physical. Chief complaint-noted.   See problem oriented charting- Review of Systems  Constitutional:  Negative for chills and fever.  HENT:  Negative for ear pain and hearing loss.   Eyes:  Negative for blurred vision and double vision.  Respiratory:  Negative for cough and shortness of breath.   Cardiovascular:  Negative for chest pain and palpitations.  Gastrointestinal:  Negative for abdominal pain, blood in stool, constipation, diarrhea, heartburn, melena, nausea and vomiting.  Genitourinary:  Negative for dysuria and frequency.  Musculoskeletal:  Negative for back pain, myalgias and neck pain.  Skin:  Negative for itching and rash.  Neurological:  Negative for dizziness and headaches.  Endo/Heme/Allergies:  Negative for polydipsia. Bruises/bleeds easily.  Psychiatric/Behavioral:  Negative for depression and suicidal ideas.     The following were reviewed and entered/updated in epic: Past Medical History:  Diagnosis Date   Arthritis    patient denies   TEAR, MEDIAL MENISCUS, KNEE, CURRENT 03/30/2007   patient denies   Patient Active Problem List   Diagnosis Date Noted   Essential hypertension 09/18/2015    Priority: Medium    Hyperlipidemia 03/28/2014    Priority: Medium    ALLERGIC RHINITIS 01/17/2007    Priority: Low   History of adenomatous polyp of colon 09/18/2015   Past Surgical History:  Procedure Laterality Date   CYST REMOVAL TRUNK     around 2005    Family History  Problem Relation Age of Onset   Hypertension Mother        died 8   Dementia Father    Healthy Sister    Healthy Sister    Healthy Sister    Healthy Sister    Healthy Sister    Hypertension Brother    Colon cancer Brother    Healthy Brother    Other Brother        on blood thinner- uncler reason   Stroke Brother        30s or 40s but hard life. died at 69- grief related ? with younger brother dying    HIV/AIDS Brother        died in 6    Medications- reviewed and updated No current outpatient medications on file.   No current facility-administered medications for this visit.    Allergies-reviewed and updated No Known Allergies  Social History   Social History Narrative   Widowed 2022 (lost mom close to same time but age 33)- Married 1987-Wife Dianna. 2 kids-Tanisha lives in [place] no kids. tashira lives in [place] with 48 kid-41 years old.       Works as Administrator.       Hobbies: tennis, fishing     Objective  Objective:  BP 130/76   Pulse 65   Temp 98.3 F (36.8 C)   Ht 5\' 11"  (1.803 m)   Wt 179 lb 12.8 oz (81.6 kg)   SpO2 98%   BMI 25.08 kg/m  Gen: NAD, resting comfortably HEENT: Mucous membranes are moist. Oropharynx normal Neck: no thyromegaly CV: RRR no murmurs rubs or gallops Lungs: CTAB no crackles, wheeze, rhonchi Abdomen: soft/nontender/nondistended/normal bowel sounds. No rebound or guarding.  Ext: no edema Skin: warm, dry Neuro: grossly normal, moves all extremities, PERRLA   Assessment and Plan  61 y.o. male presenting for annual physical.  Health Maintenance counseling: 1. Anticipatory guidance: Patient counseled regarding regular dental exams -q6 months, eye exams - advised  updating- has not had in years,  avoiding smoking and second hand smoke, limiting alcohol to 2 beverages per day, no illicit drugs .   2. Risk factor reduction:  Advised patient of need for regular exercise and diet rich and fruits and vegetables to reduce risk of heart attack and stroke.  Exercise- 4 days of tennis- [place] and public parks on weekend.  Diet/weight management- In past stated preferred 175-180 range- has been doing very well with this- trying to eat reasonably healthy Wt Readings from Last 3 Encounters:  02/04/22 179 lb 12.8 oz (81.6 kg)  10/29/21 183 lb (83 kg)  04/11/19 177 lb 12.8 oz (80.6 kg)   3. Immunizations/screenings/ancillary studies- shingrix - opts out, covid vaccine - opting out, fall flu shot - opts out Immunization History  Administered Date(s) Administered   PFIZER(Purple Top)SARS-COV-2 Vaccination 10/06/2019, 10/30/2019   Td 06/21/2005, 12/23/2005   Tdap 02/23/2018  4. Prostate cancer screening-  low risk prior trend but overdue- update psa today Lab Results  Component Value Date   PSA 0.54 09/11/2015   PSA 0.57 03/28/2014   PSA 0.68 04/23/2013   5. Colon cancer screening - 12/06/2009 with tubular adenoma- referred to GI in 2017- stressed importance of follow up today and referred again 6. Skin cancer screening- lower risk due to melanin content. advised regular sunscreen use. Denies worrisome, changing, or new skin lesions.  7. Smoking associated screening (lung cancer screening, AAA screen 65-75, UA)- never smoker 8. STD screening - abstinent after loss of wife  Status of chronic or acute concerns   #social update- cutting back on hours  in next year or two- may stick with maintenance portion  #hypertension S: medication: none- hsa preferred to manage without meds- controlling with diet/exercise Home readings #s: 120/62 on recent check with HR 52 BP Readings from Last 3 Encounters:  02/04/22 130/76  10/29/21 120/80  04/16/19 (!) 152/87  A/P: stable- monitor without meds  #hyperlipidemia S: Medication:none  Lab Results  Component Value Date   CHOL 194 09/11/2015   HDL 50.00 09/11/2015   LDLCALC 135 (H) 09/11/2015   LDLDIRECT 148.8 03/01/2012   TRIG 45.0 09/11/2015   CHOLHDL 4 09/11/2015  A/P: update lipids and calculate ascvd risk  Recommended follow up: Return in about 1 year (around 02/05/2023) for physical or sooner if needed.Schedule b4 you leave.  Lab/Order associations: fasting   ICD-10-CM   1. Preventative health care  Z00.00 CBC with Differential/Platelet    Comprehensive metabolic panel    Lipid panel    PSA    2. Hyperlipidemia,  unspecified hyperlipidemia type  E78.5 CBC with Differential/Platelet    Comprehensive metabolic panel    Lipid panel    3. Screening for prostate cancer  Z12.5 PSA    4. History of adenomatous polyp of colon  Z86.010 Ambulatory referral to Gastroenterology     Return precautions advised.  Tana Conch, MD

## 2022-02-04 NOTE — Patient Instructions (Addendum)
 GI contact Please call to schedule visit and/or procedure Address: [address] [place], [place], [place] [address] Phone: [phone]   Please stop by lab before you go If you have mychart- we will send your results within 3 business days of [place] receiving them.  If you do not have mychart- we will call you about results within 5 business days of [place] receiving them.  *please also note that you will see labs on mychart as soon as they post. I will later go in and write notes on them- will say "notes from Dr. Durene Cal"   Recommended follow up: Return in about 1 year (around 02/05/2023) for physical or sooner if needed.Schedule b4 you leave.

## 2023-01-19 ENCOUNTER — Encounter (INDEPENDENT_AMBULATORY_CARE_PROVIDER_SITE_OTHER): Payer: Self-pay

## 2023-02-07 ENCOUNTER — Encounter: Payer: PRIVATE HEALTH INSURANCE | Admitting: Family Medicine

## 2023-07-22 ENCOUNTER — Ambulatory Visit: Payer: Self-pay | Admitting: Family Medicine

## 2023-07-22 NOTE — Telephone Encounter (Signed)
  Chief Complaint: Medication Request Symptoms: Cough, Mild, Productive Frequency: Acute Pertinent Negatives: Patient denies any other symptoms Disposition: [] ED /[] Urgent Care (no appt availability in office) / [] Appointment(In office/virtual)/ []  [facility]/ [] Home Care/ [] Refused Recommended Disposition /[] [facility]/ [x]  Follow-up with PCP Additional Notes: K. Simonet is requesting a Rx for Tessalon Pearls due to an early presentation of an cough that is mild in nature with no other symptoms. Informed patient to request medication via MyChart and that he may need an appointment.    Reason for Disposition  Prescription request for new medicine (not a refill)  Answer Assessment - Initial Assessment Questions 1. DRUG NAME: "What medicine do you need to have refilled?"     Tessalon Pearls  2. REFILLS REMAINING: "How many refills are remaining?" (Note: The label on the medicine or pill bottle will show how many refills are remaining. If there are no refills remaining, then a renewal may be needed.)     None  3. EXPIRATION DATE: "What is the expiration date?" (Note: The label states when the prescription will expire, and thus can no longer be refilled.)     None  4. PRESCRIBING HCP: "Who prescribed it?" Reason: If prescribed by specialist, call should be referred to that group.     Dr. Durene Cal and Dr. Lovell Sheehan  5. SYMPTOMS: "Do you have any symptoms?"     Cough  Protocols used: Medication Refill and Renewal Call-A-AH

## 2023-07-22 NOTE — Telephone Encounter (Signed)
Please schedule ov with available provider.

## 2023-07-22 NOTE — Telephone Encounter (Signed)
1st attempt, LMOM to return call to office. Routing to call back.  Copied from CRM [id number]. Topic: Clinical - Medication Question >> Jul 22, 2023 11:17 AM Denese Killings wrote: Reason for CRM: Patient is requesting to get cough medicine.  He is coughing up mucus. Patient is requesting a callback.

## 2023-12-05 ENCOUNTER — Telehealth: Payer: Self-pay

## 2023-12-05 NOTE — Telephone Encounter (Signed)
 Pt has not been seen in our clinic since 02/04/22. Please schedule ov for pt.  Copied from CRM [id number]. Topic: Appointments - Scheduling Inquiry for Clinic >> Dec 05, 2023 10:46 AM Chuck Crater wrote: Reason for CRM: Patient wants to schedule a colonoscopy.

## 2023-12-05 NOTE — Telephone Encounter (Signed)
 Contacted pt and scheduled.

## 2023-12-12 ENCOUNTER — Ambulatory Visit: Payer: Self-pay | Admitting: Family Medicine

## 2023-12-12 ENCOUNTER — Ambulatory Visit (INDEPENDENT_AMBULATORY_CARE_PROVIDER_SITE_OTHER): Payer: PRIVATE HEALTH INSURANCE | Admitting: Family Medicine

## 2023-12-12 ENCOUNTER — Encounter: Payer: Self-pay | Admitting: Family Medicine

## 2023-12-12 ENCOUNTER — Encounter: Payer: Self-pay | Admitting: Internal Medicine

## 2023-12-12 VITALS — BP 130/80 | HR 51 | Temp 98.1°F | Ht 71.0 in | Wt 180.2 lb

## 2023-12-12 DIAGNOSIS — E785 Hyperlipidemia, unspecified: Secondary | ICD-10-CM

## 2023-12-12 DIAGNOSIS — Z125 Encounter for screening for malignant neoplasm of prostate: Secondary | ICD-10-CM | POA: Diagnosis not present

## 2023-12-12 DIAGNOSIS — Z0000 Encounter for general adult medical examination without abnormal findings: Secondary | ICD-10-CM | POA: Diagnosis not present

## 2023-12-12 DIAGNOSIS — Z131 Encounter for screening for diabetes mellitus: Secondary | ICD-10-CM | POA: Diagnosis not present

## 2023-12-12 DIAGNOSIS — Z1211 Encounter for screening for malignant neoplasm of colon: Secondary | ICD-10-CM

## 2023-12-12 DIAGNOSIS — E663 Overweight: Secondary | ICD-10-CM

## 2023-12-12 LAB — CBC WITH DIFFERENTIAL/PLATELET
Basophils Absolute: 0.1 10*3/uL (ref 0.0–0.1)
Basophils Relative: 1.7 % (ref 0.0–3.0)
Eosinophils Absolute: 0.1 10*3/uL (ref 0.0–0.7)
Eosinophils Relative: 3.6 % (ref 0.0–5.0)
HCT: 42.4 % (ref 39.0–52.0)
Hemoglobin: 14.1 g/dL (ref 13.0–17.0)
Lymphocytes Relative: 21.4 % (ref 12.0–46.0)
Lymphs Abs: 0.6 10*3/uL — ABNORMAL LOW (ref 0.7–4.0)
MCHC: 33.2 g/dL (ref 30.0–36.0)
MCV: 94.4 fl (ref 78.0–100.0)
Monocytes Absolute: 0.3 10*3/uL (ref 0.1–1.0)
Monocytes Relative: 11 % (ref 3.0–12.0)
Neutro Abs: 1.9 10*3/uL (ref 1.4–7.7)
Neutrophils Relative %: 62.3 % (ref 43.0–77.0)
Platelets: 214 10*3/uL (ref 150.0–400.0)
RBC: 4.49 Mil/uL (ref 4.22–5.81)
RDW: 13.8 % (ref 11.5–15.5)
WBC: 3 10*3/uL — ABNORMAL LOW (ref 4.0–10.5)

## 2023-12-12 LAB — COMPREHENSIVE METABOLIC PANEL WITH GFR
ALT: 15 U/L (ref 0–53)
AST: 20 U/L (ref 0–37)
Albumin: 4.2 g/dL (ref 3.5–5.2)
Alkaline Phosphatase: 61 U/L (ref 39–117)
BUN: 12 mg/dL (ref 6–23)
CO2: 31 meq/L (ref 19–32)
Calcium: 9.3 mg/dL (ref 8.4–10.5)
Chloride: 105 meq/L (ref 96–112)
Creatinine, Ser: 1.34 mg/dL (ref 0.40–1.50)
GFR: 55.84 mL/min — ABNORMAL LOW (ref >60.00)
Glucose, Bld: 92 mg/dL (ref 70–99)
Potassium: 4.7 meq/L (ref 3.5–5.1)
Sodium: 141 meq/L (ref 135–145)
Total Bilirubin: 1.1 mg/dL (ref 0.2–1.2)
Total Protein: 6.7 g/dL (ref 6.0–8.3)

## 2023-12-12 LAB — LIPID PANEL
Cholesterol: 192 mg/dL (ref 0–200)
HDL: 50 mg/dL (ref >39.00)
LDL Cholesterol: 133 mg/dL — ABNORMAL HIGH (ref 0–99)
NonHDL: 142.35
Total CHOL/HDL Ratio: 4
Triglycerides: 47 mg/dL (ref 0.0–149.0)
VLDL: 9.4 mg/dL (ref 0.0–40.0)

## 2023-12-12 LAB — HEMOGLOBIN A1C: Hgb A1c MFr Bld: 5.8 % (ref 4.6–6.5)

## 2023-12-12 LAB — PSA: PSA: 1 ng/mL (ref 0.10–4.00)

## 2023-12-12 NOTE — Patient Instructions (Addendum)
 [facility] contact Please call to schedule visit and/or procedure IF you do not hear within a week Address: [address] [place], [place], [place] [address] Phone: [phone]   Please stop by lab before you go If you have mychart- we will send your results within 3 business days of us  receiving them.  If you do not have mychart- we will call you about results within 5 business days of us  receiving them.  *please also note that you will see labs on mychart as soon as they post. I will later go in and write notes on them- will say notes from Dr. Katrinka   Recommended follow up: Return in about 1 year (around 12/11/2024) for physical or sooner if needed.Schedule b4 you leave.

## 2023-12-12 NOTE — Progress Notes (Signed)
 Phone: [phone]   Subjective:  Patient presents today for their annual physical. Chief complaint-noted.   See problem oriented charting- ROS- full  review of systems was completed and negative  Per full ROS sheet completed by patient except for topics noted under acute/chronic concerns  The following were reviewed and entered/updated in epic: Past Medical History:  Diagnosis Date   Arthritis    patient denies   TEAR, MEDIAL MENISCUS, KNEE, CURRENT 03/30/2007   patient denies   Patient Active Problem List   Diagnosis Date Noted   Essential hypertension 09/18/2015    Priority: Medium    Hyperlipidemia 03/28/2014    Priority: Medium    ALLERGIC RHINITIS 01/17/2007    Priority: Low   History of adenomatous polyp of colon 09/18/2015   Past Surgical History:  Procedure Laterality Date   CYST REMOVAL TRUNK     around 2005    Family History  Problem Relation Age of Onset   Hypertension Mother        died 19   Dementia Father    Healthy Sister    Healthy Sister    Healthy Sister    Healthy Sister    Healthy Sister    Hypertension Brother    Colon cancer Brother    Healthy Brother    Other Brother        on blood thinner- uncler reason   Stroke Brother        30s or 40s but hard life. died at 48- grief related ? with younger brother dying   HIV/AIDS Brother        died in 75    Medications- reviewed and updated No current outpatient medications on file.   No current facility-administered medications for this visit.    Allergies-reviewed and updated No Known Allergies  Social History   Social History Narrative   Widowed 2022 (lost mom close to same time but age 75)- Married 1987-Wife Dianna. 2 kids-Tanisha (nurse) lives in [place] no kids. 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 They live at his [address] house- They also  rent out another home air B&B. [place] with home with his niece 80 (he's  guardian)      More part time- Works as Administrator.       Hobbies: tennis, fishing     Objective  Objective:  BP 130/80   Pulse (!) 51   Temp 98.1 F (36.7 C)   Ht 5' 11 (1.803 m)   Wt 180 lb 3.2 oz (81.7 kg)   SpO2 96%   BMI 25.13 kg/m  Gen: NAD, resting comfortably HEENT: Mucous membranes are moist. Oropharynx normal Neck: no thyromegaly CV: RRR no murmurs rubs or gallops Lungs: CTAB no crackles, wheeze, rhonchi Abdomen: soft/nontender/nondistended/normal bowel sounds. No rebound or guarding.  Ext: no edema Skin: warm, dry Neuro: grossly normal, moves all extremities, PERRLA    Assessment and Plan  63 y.o. male presenting for annual physical.  Health Maintenance counseling: 1. Anticipatory guidance: Patient counseled regarding regular dental exams - advised close to q6 months- at least yearly though, eye exams - consider updating an exam,  avoiding smoking and second hand smoke , limiting alcohol to 2 beverages per day - doesn't drink, no illicit drugs .   2. Risk factor reduction:  Advised patient of need for regular exercise and diet rich and fruits and vegetables to reduce risk of  heart attack and stroke.  Exercise- very active with work- tennis 4-5 days a week still.  Diet/weight management-weight largely stable from last visit. Encouraged to watch salt intake- has already reduced red meats.  Wt Readings from Last 3 Encounters:  12/12/23 180 lb 3.2 oz (81.7 kg)  02/04/22 179 lb 12.8 oz (81.6 kg)  10/29/21 183 lb (83 kg)  3. Immunizations/screenings/ancillary studies-discussed Shingrix- opts out for now, discussed COVID-19 vaccination- opts out  Immunization History  Administered Date(s) Administered   PFIZER(Purple Top)SARS-COV-2 Vaccination 10/06/2019, 10/30/2019   Td 06/21/2005, 12/23/2005   Tdap 02/23/2018  4. Prostate cancer screening- low risk prior trend- update psa today   Lab Results  Component Value Date   PSA 0.81 02/04/2022    PSA 0.54 09/11/2015   PSA 0.57 03/28/2014   5. Colon cancer screening - 11/26/2009 with tubular adenoma and referred to GI in 2017 and stressed importance of this follow-up at that time.  We will refer him back today 6. Skin cancer screening- no dermatologist. advised regular sunscreen use. Denies worrisome, changing, or new skin lesions.  7. Smoking associated screening (lung cancer screening, AAA screen 65-75, UA)-never smoker 8. STD screening - uses protection everytime   Status of chronic or acute concerns   #hypertension S: medication: None-thankfully has controlled with diet and exercise. Even had bacon this morning and still controlled!  BP Readings from Last 3 Encounters:  12/12/23 130/80  02/04/22 130/76  10/29/21 120/80  A/P: blood pressure well controlled without medications- continue to monitor - hed prefer for it to be even lower- reducing salt may help  #hyperlipidemia S: Medication: None The 10-year ASCVD risk score (Arnett DK, et al., 2019) is: 9.6%  Lab Results  Component Value Date   CHOL 216 (H) 02/04/2022   HDL 57.20 02/04/2022   LDLCALC 144 (H) 02/04/2022   LDLDIRECT 148.8 03/01/2012   TRIG 70.0 02/04/2022   CHOLHDL 4 02/04/2022   A/P: With prior elevated ASCVD risk discussed CT calcium scoring option- he wants to think it over and get labs first   Recommended follow up: Return in about 1 year (around 12/11/2024) for physical or sooner if needed.Schedule b4 you leave.  Lab/Order associations: fasting other than 1 slice of bacon   ICD-10-CM   1. Preventative health care  Z00.00     2. Screening for prostate cancer  Z12.5 PSA    3. Screening for diabetes mellitus  Z13.1 Hemoglobin A1c    4. Overweight  E66.3 Hemoglobin A1c    5. Hyperlipidemia, unspecified hyperlipidemia type  E78.5 Comprehensive metabolic panel with GFR    CBC with Differential/Platelet    Lipid panel    6. Screen for colon cancer  Z12.11 Ambulatory referral to Gastroenterology       No orders of the defined types were placed in this encounter.   Return precautions advised.  Garnette Lukes, MD

## 2023-12-15 IMAGING — DX DG THORACIC SPINE 2V
2 series · 2 of 2 positions shown · non-contrast
Comparison: None Available.

CLINICAL DATA: Upper back pain for 2 months, initial encounter

EXAM:
THORACIC SPINE 2 VIEWS

[t-spine ap]
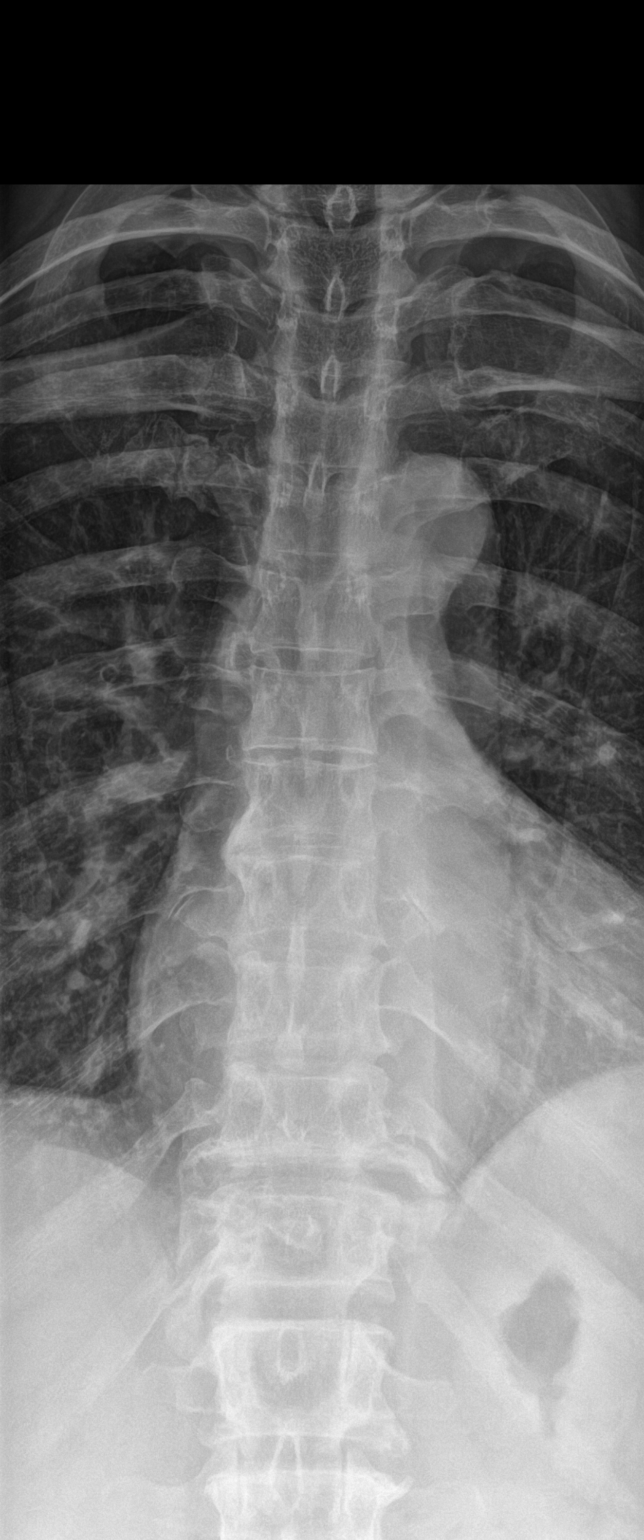

[t-spine lat]
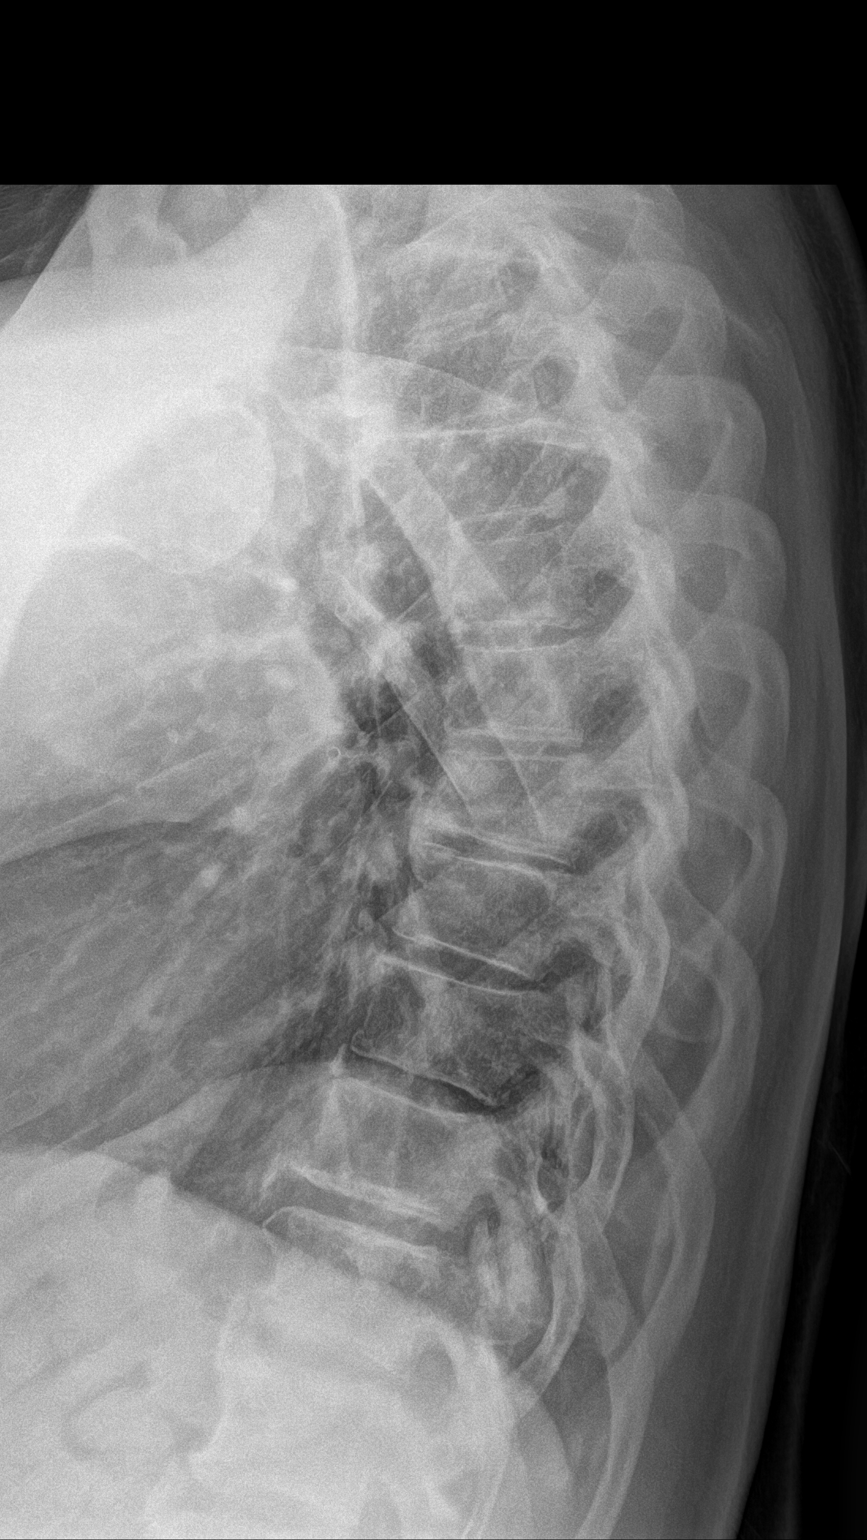

[2 of 2 positions shown; findings below may reference images not displayed]

FINDINGS: There is no evidence of thoracic spine fracture. Alignment is
normal. No other significant bone abnormalities are identified.
IMPRESSION: No acute abnormality noted.

## 2023-12-15 IMAGING — DX DG CERVICAL SPINE 2 OR 3 VIEWS
3 series · 3 of 3 positions shown · non-contrast
Comparison: None Available.

CLINICAL DATA: Neck pain and left scapular pain for several months,
no known injury, initial encounter

EXAM:
CERVICAL SPINE - 3 VIEW

[c-spine lat]
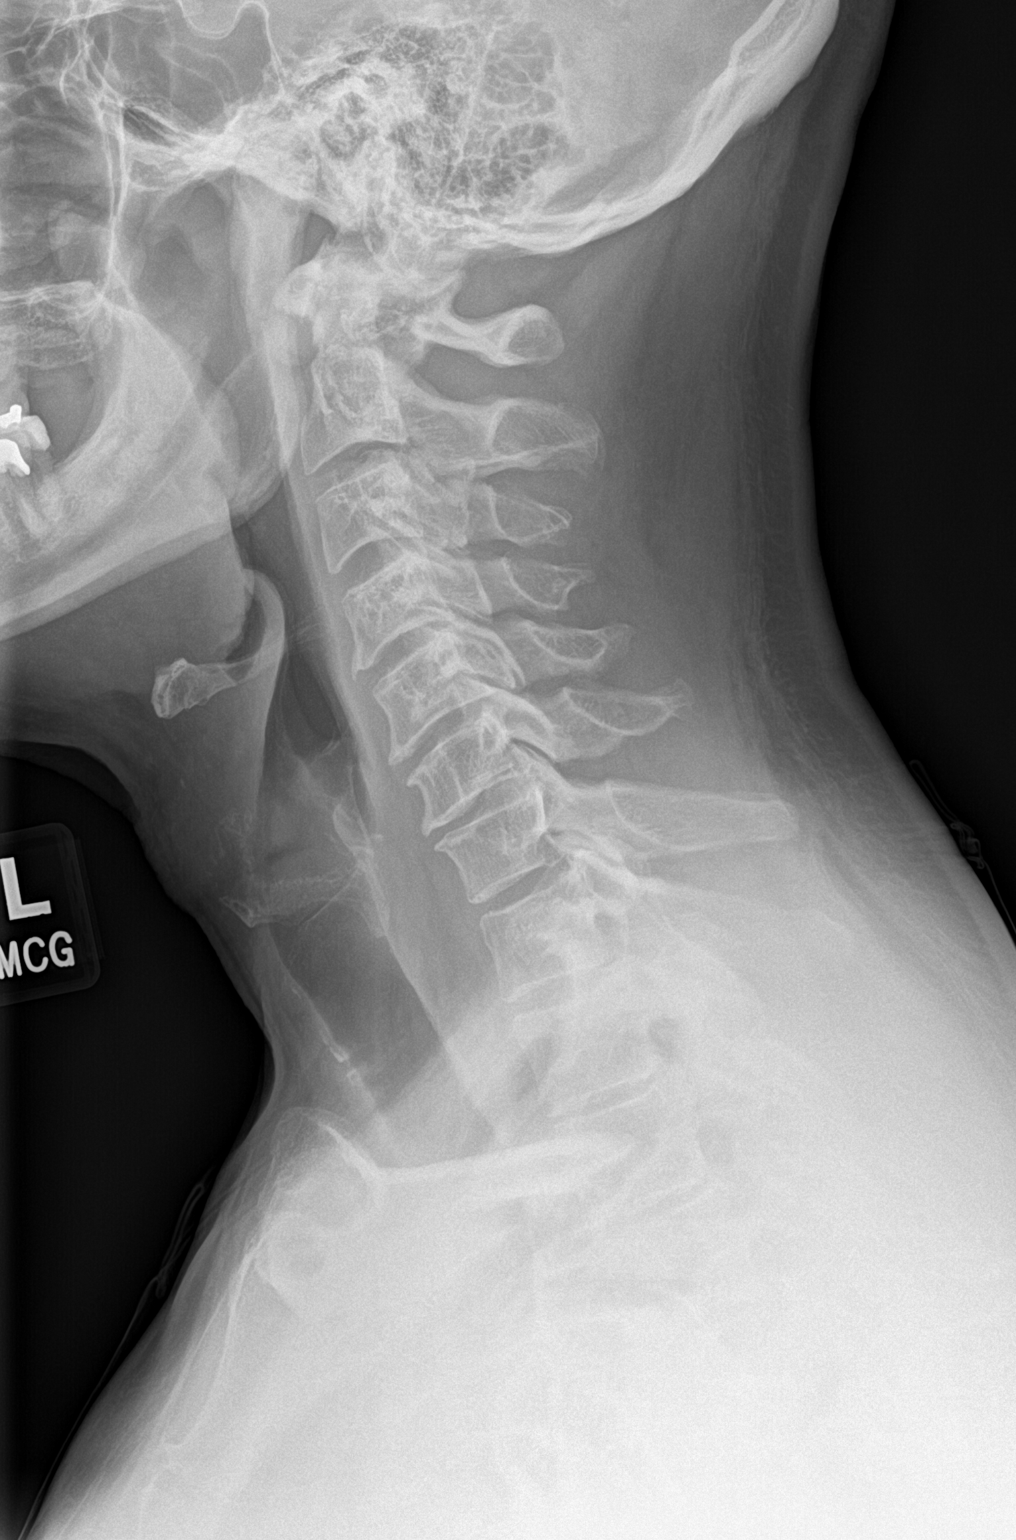

[c-spine ap]
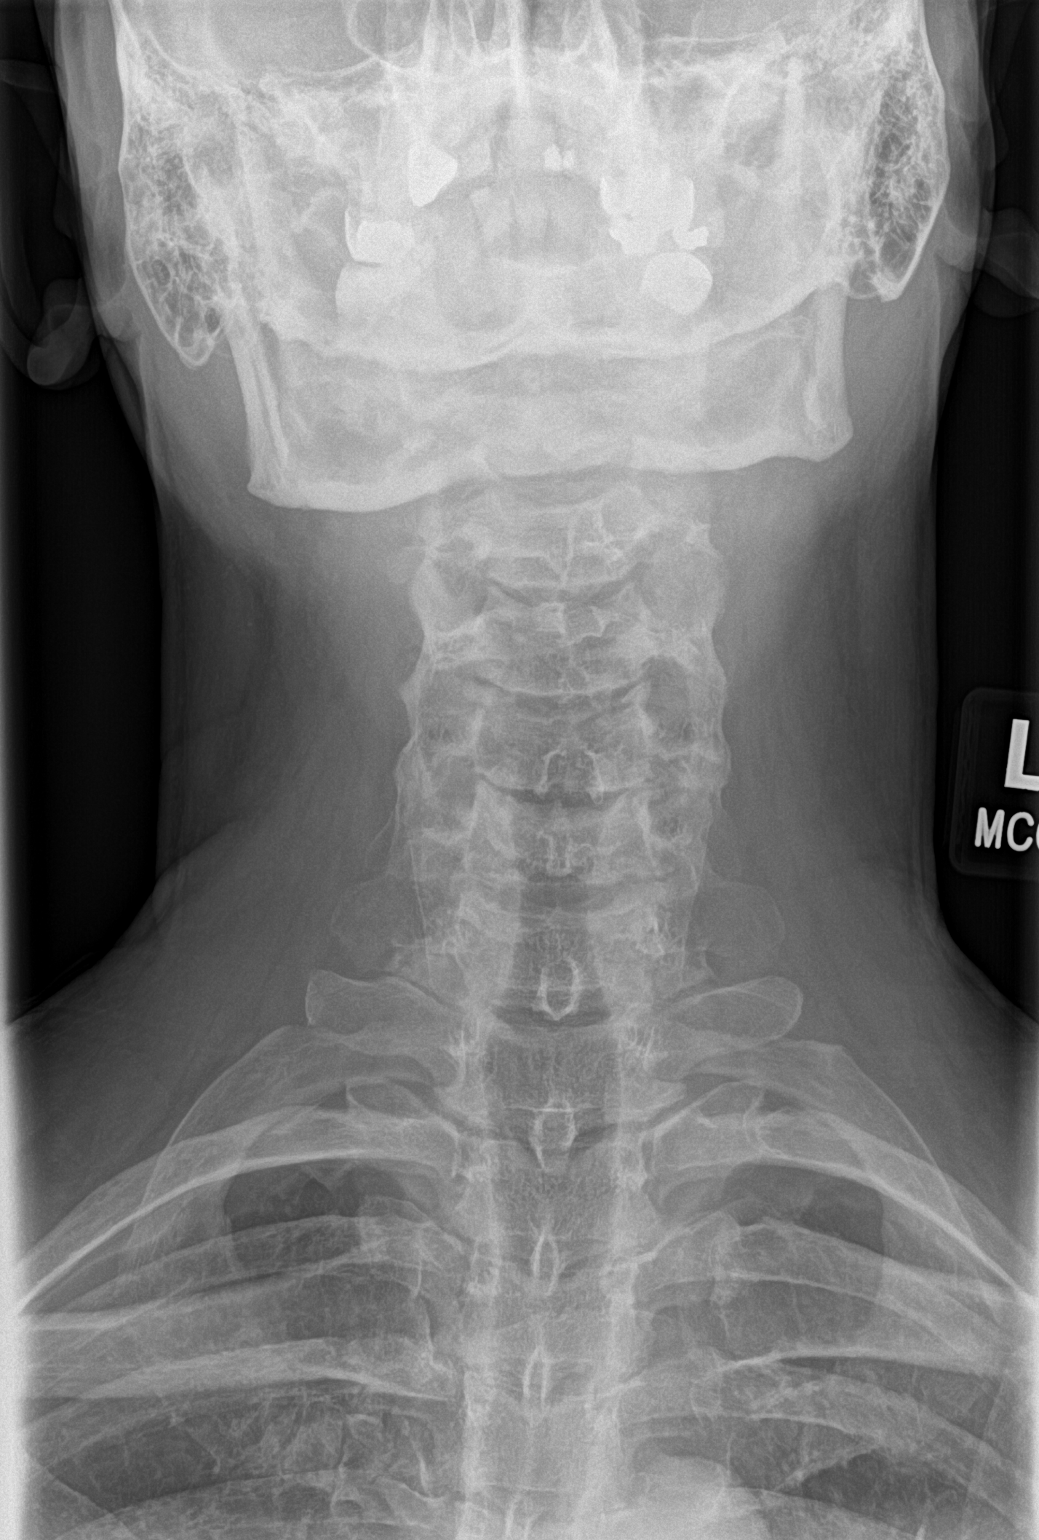

[c-spine open mouth]
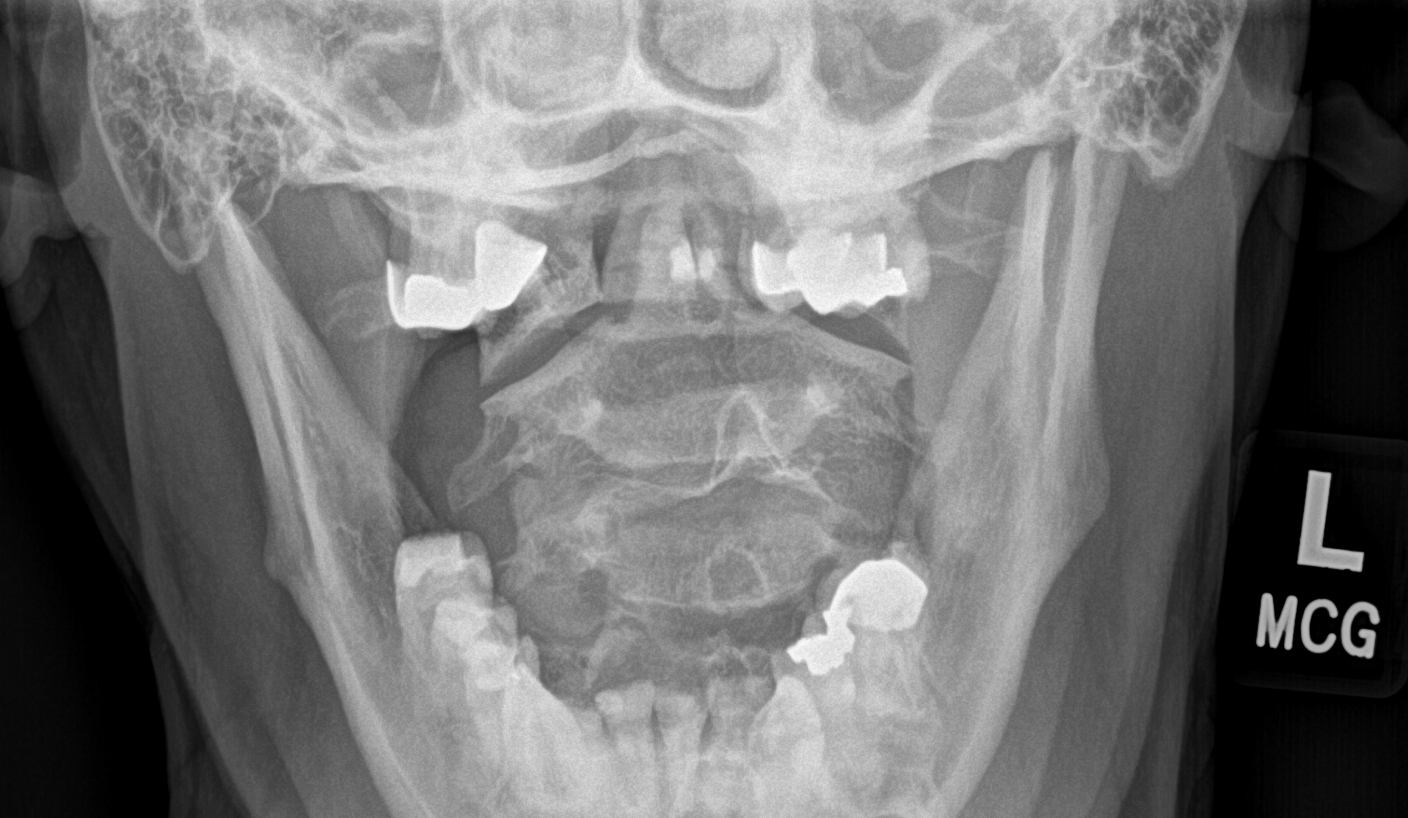

[3 of 3 positions shown; findings below may reference images not displayed]

FINDINGS: Seven cervical segments are well visualized. Vertebral body height
is well maintained. Mild osteophytic changes are seen. No
prevertebral soft tissue changes are noted. The odontoid is within
limits.
IMPRESSION: Multilevel degenerative change without acute abnormality.

## 2024-01-23 ENCOUNTER — Encounter

## 2024-01-23 ENCOUNTER — Telehealth: Payer: Self-pay

## 2024-01-23 NOTE — Telephone Encounter (Signed)
Multiple attempts made to reach patient- NO answer-message left for patient to call back to the office to reschedule PV appt- if patient fails to call back to the office prior to end of business day ---PV and procedure appts will be cancelled and a no show letter will be sent to the patient;

## 2024-02-03 ENCOUNTER — Encounter: Admitting: Internal Medicine

## 2024-07-13 ENCOUNTER — Telehealth: Payer: Self-pay | Admitting: Family Medicine

## 2024-07-13 NOTE — Telephone Encounter (Signed)
 LVM to reschedule pt's 12/13/2024 physical since the Provider will no longer be in office that day. Cancelled existing appt

## 2024-12-13 ENCOUNTER — Encounter: Admitting: Family Medicine
